# Patient Record
Sex: Male | Born: 1950 | Race: Black or African American | Hispanic: No | Marital: Married | Smoking: Current every day smoker
Health system: Southern US, Community
[De-identification: ages and names within clinical notes are randomized; demographics above are authoritative.]

## PROBLEM LIST (undated history)

## (undated) DIAGNOSIS — K219 Gastro-esophageal reflux disease without esophagitis: Secondary | ICD-10-CM

## (undated) DIAGNOSIS — R569 Unspecified convulsions: Secondary | ICD-10-CM

## (undated) DIAGNOSIS — E119 Type 2 diabetes mellitus without complications: Secondary | ICD-10-CM

## (undated) DIAGNOSIS — F32A Depression, unspecified: Secondary | ICD-10-CM

## (undated) DIAGNOSIS — F329 Major depressive disorder, single episode, unspecified: Secondary | ICD-10-CM

## (undated) DIAGNOSIS — G44009 Cluster headache syndrome, unspecified, not intractable: Secondary | ICD-10-CM

## (undated) DIAGNOSIS — D351 Benign neoplasm of parathyroid gland: Secondary | ICD-10-CM

## (undated) HISTORY — PX: SPLENECTOMY, TOTAL: SHX788

## (undated) HISTORY — PX: NASAL SEPTUM SURGERY: SHX37

---

## 1972-05-28 HISTORY — PX: TRACHEOSTOMY: SUR1362

## 2004-03-04 ENCOUNTER — Emergency Department: Payer: Self-pay | Admitting: Emergency Medicine

## 2004-07-25 ENCOUNTER — Emergency Department (HOSPITAL_COMMUNITY): Admission: EM | Admit: 2004-07-25 | Discharge: 2004-07-25 | Payer: Self-pay | Admitting: Emergency Medicine

## 2006-06-10 ENCOUNTER — Emergency Department: Payer: Self-pay | Admitting: Emergency Medicine

## 2007-12-20 ENCOUNTER — Emergency Department: Payer: Self-pay | Admitting: Emergency Medicine

## 2008-02-27 ENCOUNTER — Emergency Department: Payer: Self-pay | Admitting: Emergency Medicine

## 2008-09-22 ENCOUNTER — Emergency Department: Payer: Self-pay | Admitting: Emergency Medicine

## 2010-03-16 ENCOUNTER — Ambulatory Visit: Payer: Self-pay | Admitting: Unknown Physician Specialty

## 2010-03-28 ENCOUNTER — Ambulatory Visit: Payer: Self-pay | Admitting: Unknown Physician Specialty

## 2010-04-27 ENCOUNTER — Ambulatory Visit: Payer: Self-pay | Admitting: Unknown Physician Specialty

## 2010-05-28 ENCOUNTER — Ambulatory Visit: Payer: Self-pay | Admitting: Unknown Physician Specialty

## 2010-06-28 ENCOUNTER — Ambulatory Visit: Payer: Self-pay | Admitting: Unknown Physician Specialty

## 2010-08-08 ENCOUNTER — Other Ambulatory Visit (HOSPITAL_COMMUNITY): Payer: Self-pay

## 2011-03-09 ENCOUNTER — Ambulatory Visit: Payer: Self-pay | Admitting: Unknown Physician Specialty

## 2011-03-29 ENCOUNTER — Ambulatory Visit: Payer: Self-pay | Admitting: Unknown Physician Specialty

## 2011-04-28 ENCOUNTER — Ambulatory Visit: Payer: Self-pay | Admitting: Unknown Physician Specialty

## 2011-05-29 ENCOUNTER — Ambulatory Visit: Payer: Self-pay | Admitting: Unknown Physician Specialty

## 2011-06-14 ENCOUNTER — Ambulatory Visit: Payer: Self-pay | Admitting: Unknown Physician Specialty

## 2011-06-29 ENCOUNTER — Ambulatory Visit: Payer: Self-pay | Admitting: Unknown Physician Specialty

## 2011-07-06 LAB — RAPID URINE DRUG SCREEN, HOSP PERFORMED
Amphetamines, Ur Screen: NEGATIVE (ref ?–1000)
Barbiturates, Ur Screen: NEGATIVE (ref ?–200)
Benzodiazepine, Ur Scrn: NEGATIVE (ref ?–200)
Opiate, Ur Screen: NEGATIVE (ref ?–300)

## 2013-08-08 ENCOUNTER — Emergency Department: Payer: Self-pay | Admitting: Emergency Medicine

## 2013-09-17 ENCOUNTER — Emergency Department: Payer: Self-pay | Admitting: Emergency Medicine

## 2013-09-17 LAB — URINALYSIS, COMPLETE
BACTERIA: NONE SEEN
BILIRUBIN, UR: NEGATIVE
BLOOD: NEGATIVE
Glucose,UR: NEGATIVE mg/dL (ref 0–75)
Hyaline Cast: 3
KETONE: NEGATIVE
Leukocyte Esterase: NEGATIVE
Nitrite: NEGATIVE
Ph: 5 (ref 4.5–8.0)
Protein: 30
RBC,UR: 1 /HPF (ref 0–5)
Specific Gravity: 1.028 (ref 1.003–1.030)
Squamous Epithelial: <1

## 2013-09-17 LAB — CBC WITH DIFFERENTIAL/PLATELET
BASOS ABS: 0.1 10*3/uL (ref 0.0–0.1)
Basophil %: 1 %
EOS PCT: 1.3 %
Eosinophil #: 0.1 10*3/uL (ref 0.0–0.7)
HCT: 45.7 % (ref 40.0–52.0)
HGB: 15.5 g/dL (ref 13.0–18.0)
Lymphocyte #: 2.2 10*3/uL (ref 1.0–3.6)
Lymphocyte %: 24.9 %
MCH: 32.2 pg (ref 26.0–34.0)
MCHC: 33.8 g/dL (ref 32.0–36.0)
MCV: 95 fL (ref 80–100)
MONO ABS: 0.7 x10 3/mm (ref 0.2–1.0)
Monocyte %: 7.7 %
NEUTROS PCT: 65.1 %
Neutrophil #: 5.9 10*3/uL (ref 1.4–6.5)
Platelet: 240 10*3/uL (ref 150–440)
RBC: 4.8 10*6/uL (ref 4.40–5.90)
RDW: 13.9 % (ref 11.5–14.5)
WBC: 9 10*3/uL (ref 3.8–10.6)

## 2013-09-17 LAB — BASIC METABOLIC PANEL
ANION GAP: 6 — AB (ref 7–16)
BUN: 15 mg/dL (ref 7–18)
CHLORIDE: 106 mmol/L (ref 98–107)
CO2: 27 mmol/L (ref 21–32)
CREATININE: 1.45 mg/dL — AB (ref 0.60–1.30)
Calcium, Total: 8.8 mg/dL (ref 8.5–10.1)
GFR CALC AF AMER: 60 — AB
GFR CALC NON AF AMER: 52 — AB
GLUCOSE: 135 mg/dL — AB (ref 65–99)
OSMOLALITY: 280 (ref 275–301)
POTASSIUM: 4 mmol/L (ref 3.5–5.1)
Sodium: 139 mmol/L (ref 136–145)

## 2013-09-17 LAB — CK: CK, Total: 502 U/L — ABNORMAL HIGH

## 2013-09-18 LAB — URINE CULTURE

## 2013-09-22 LAB — CULTURE, BLOOD (SINGLE)

## 2014-03-29 ENCOUNTER — Encounter (HOSPITAL_COMMUNITY): Payer: Self-pay | Admitting: *Deleted

## 2014-03-29 ENCOUNTER — Emergency Department (HOSPITAL_COMMUNITY)
Admission: EM | Admit: 2014-03-29 | Discharge: 2014-03-29 | Disposition: A | Discharge status: Home / Self Care | Payer: BC Managed Care – PPO | Attending: Emergency Medicine | Admitting: Emergency Medicine

## 2014-03-29 ENCOUNTER — Emergency Department (HOSPITAL_COMMUNITY): Payer: BC Managed Care – PPO

## 2014-03-29 DIAGNOSIS — Y990 Civilian activity done for income or pay: Secondary | ICD-10-CM | POA: Diagnosis not present

## 2014-03-29 DIAGNOSIS — Z79899 Other long term (current) drug therapy: Secondary | ICD-10-CM | POA: Diagnosis not present

## 2014-03-29 DIAGNOSIS — Y9289 Other specified places as the place of occurrence of the external cause: Secondary | ICD-10-CM | POA: Insufficient documentation

## 2014-03-29 DIAGNOSIS — B351 Tinea unguium: Secondary | ICD-10-CM | POA: Insufficient documentation

## 2014-03-29 DIAGNOSIS — Z720 Tobacco use: Secondary | ICD-10-CM | POA: Insufficient documentation

## 2014-03-29 DIAGNOSIS — W1830XA Fall on same level, unspecified, initial encounter: Secondary | ICD-10-CM | POA: Insufficient documentation

## 2014-03-29 DIAGNOSIS — K219 Gastro-esophageal reflux disease without esophagitis: Secondary | ICD-10-CM | POA: Diagnosis not present

## 2014-03-29 DIAGNOSIS — M6281 Muscle weakness (generalized): Secondary | ICD-10-CM | POA: Insufficient documentation

## 2014-03-29 DIAGNOSIS — S8991XA Unspecified injury of right lower leg, initial encounter: Secondary | ICD-10-CM | POA: Insufficient documentation

## 2014-03-29 DIAGNOSIS — S8992XA Unspecified injury of left lower leg, initial encounter: Secondary | ICD-10-CM | POA: Diagnosis not present

## 2014-03-29 DIAGNOSIS — W1839XA Other fall on same level, initial encounter: Secondary | ICD-10-CM | POA: Diagnosis not present

## 2014-03-29 DIAGNOSIS — R29898 Other symptoms and signs involving the musculoskeletal system: Secondary | ICD-10-CM

## 2014-03-29 HISTORY — DX: Gastro-esophageal reflux disease without esophagitis: K21.9

## 2014-03-29 LAB — CBC WITH DIFFERENTIAL/PLATELET
BASOS ABS: 0 10*3/uL (ref 0.0–0.1)
BASOS PCT: 0 % (ref 0–1)
EOS ABS: 0.1 10*3/uL (ref 0.0–0.7)
Eosinophils Relative: 1 % (ref 0–5)
HCT: 41.2 % (ref 39.0–52.0)
Hemoglobin: 13.9 g/dL (ref 13.0–17.0)
Lymphocytes Relative: 13 % (ref 12–46)
Lymphs Abs: 1.9 10*3/uL (ref 0.7–4.0)
MCH: 31.7 pg (ref 26.0–34.0)
MCHC: 33.7 g/dL (ref 30.0–36.0)
MCV: 94.1 fL (ref 78.0–100.0)
MONOS PCT: 4 % (ref 3–12)
Monocytes Absolute: 0.6 10*3/uL (ref 0.1–1.0)
NEUTROS PCT: 82 % — AB (ref 43–77)
Neutro Abs: 12.5 10*3/uL — ABNORMAL HIGH (ref 1.7–7.7)
PLATELETS: 229 10*3/uL (ref 150–400)
RBC: 4.38 MIL/uL (ref 4.22–5.81)
RDW: 13.2 % (ref 11.5–15.5)
WBC: 15.2 10*3/uL — ABNORMAL HIGH (ref 4.0–10.5)

## 2014-03-29 LAB — COMPREHENSIVE METABOLIC PANEL
ALBUMIN: 3.9 g/dL (ref 3.5–5.2)
ALK PHOS: 60 U/L (ref 39–117)
ALT: 13 U/L (ref 0–53)
ANION GAP: 14 (ref 5–15)
AST: 20 U/L (ref 0–37)
BUN: 14 mg/dL (ref 6–23)
CO2: 22 mEq/L (ref 19–32)
Calcium: 9.5 mg/dL (ref 8.4–10.5)
Chloride: 103 mEq/L (ref 96–112)
Creatinine, Ser: 1.09 mg/dL (ref 0.50–1.35)
GFR calc Af Amer: 83 mL/min — ABNORMAL LOW (ref >90)
GFR calc non Af Amer: 71 mL/min — ABNORMAL LOW (ref >90)
Glucose, Bld: 96 mg/dL (ref 70–99)
POTASSIUM: 3.9 meq/L (ref 3.7–5.3)
SODIUM: 139 meq/L (ref 137–147)
TOTAL PROTEIN: 7.6 g/dL (ref 6.0–8.3)
Total Bilirubin: 0.6 mg/dL (ref 0.3–1.2)

## 2014-03-29 LAB — URINALYSIS, ROUTINE W REFLEX MICROSCOPIC
BILIRUBIN URINE: NEGATIVE
Glucose, UA: NEGATIVE mg/dL
Hgb urine dipstick: NEGATIVE
Ketones, ur: NEGATIVE mg/dL
LEUKOCYTES UA: NEGATIVE
NITRITE: NEGATIVE
PH: 7 (ref 5.0–8.0)
Protein, ur: NEGATIVE mg/dL
SPECIFIC GRAVITY, URINE: 1.016 (ref 1.005–1.030)
Urobilinogen, UA: 1 mg/dL (ref 0.0–1.0)

## 2014-03-29 NOTE — Discharge Instructions (Signed)
As discussed, it is important that you follow up as soon as possible with your physician for continued management of your condition.  Your episode of weakness, and ongoing pain is likely due to inflammation in the lower back.  Recall that you have one outstanding lab results, and he will be made aware of abnormal findings.  Please also recall that there is evidence for abnormalities about the first lumbar vertebrae.  This should be discussed with your primary care team.  If you develop any new, or concerning changes in your condition, please return to the emergency department immediately.

## 2014-03-29 NOTE — ED Notes (Signed)
Will get urine sample once patient returns from CT

## 2014-03-29 NOTE — ED Notes (Signed)
Bed: [id number] Expected date: 03/29/14 Expected time: 12:42 PM Means of arrival:  Comments: Ems

## 2014-03-29 NOTE — ED Provider Notes (Signed)
CSN: [id number]     Arrival date & time 03/29/14  1245 History   First MD Initiated Contact with Patient 03/29/14 1302     No chief complaint on file.     HPI  Patient presents with concern of ongoing leg pain.  Today the patient had an acute exacerbation, felt particularly weak, fell to the ground. Patient has [facility], and was recently prescribed gabapentin.  First dose was last night. Today, while at work, patient felt both of his legs with burning sensation posteriorly, felt weak, and fell to the ground. Patient denies loss of consciousness. Patient denies chest pain either before or after, or confusion before or after. Currently the patient has minimal complaints. He denies weakness in either extremity currently. He also denies abdominal pain or incontinence. Patient has bilateral onychomycosis in his feet, and is using only topical medication.  Past Medical History  Diagnosis Date  . GERD (gastroesophageal reflux disease)    Past Surgical History  Procedure Laterality Date  . Splenectomy, total     History reviewed. No pertinent family history. History  Substance Use Topics  . Smoking status: Current Every Day Smoker -- 1.00 packs/day    Types: Cigarettes  . Smokeless tobacco: Not on file  . Alcohol Use: No    Review of Systems  Constitutional:       Per HPI, otherwise negative  HENT:       Per HPI, otherwise negative  Respiratory:       Per HPI, otherwise negative  Cardiovascular:       Per HPI, otherwise negative  Gastrointestinal: Negative for vomiting.  Endocrine:       Negative aside from HPI  Genitourinary:       Neg aside from HPI   Musculoskeletal:       Per HPI, otherwise negative  Skin: Negative.   Neurological: Positive for weakness. Negative for syncope and headaches.      Allergies  Review of patient's allergies indicates no known allergies.  Home Medications   Prior to Admission  medications   Medication Sig Start Date End Date Taking? Authorizing Provider  gabapentin (NEURONTIN) 300 MG capsule Take 300 mg by mouth at bedtime.   Yes Historical Provider, MD  Ranitidine HCl (RANITIDINE 75 PO) Take 1 tablet by mouth 2 (two) times daily.   Yes Historical Provider, MD  tamsulosin (FLOMAX) 0.4 MG CAPS capsule Take 0.4 mg by mouth daily.   Yes Historical Provider, MD   BP 104/67 mmHg  Pulse 66  Temp(Src) 98.5 F (36.9 C)  Resp 17  SpO2 100% Physical Exam  Constitutional: He is oriented to person, place, and time. He appears well-developed. No distress.  HENT:  Head: Normocephalic and atraumatic.  Eyes: Conjunctivae and EOM are normal.  Cardiovascular: Normal rate and regular rhythm.   Pulmonary/Chest: Effort normal. No stridor. No respiratory distress.  Abdominal: He exhibits no distension.  Musculoskeletal: He exhibits no edema.  Neurological: He is alert and oriented to person, place, and time.  Bilateral lower extremity symmetric strength 5/5 in hips, knees, ankles. Face is symmetric, speech is clear, reflexes are appropriate in the distal lower extremity  Skin: Skin is warm and dry.  Bilateral diffuse fungal infections of all toenails  Psychiatric: He has a normal mood and affect.  Nursing note and vitals reviewed.   ED Course  Procedures (including critical care time) Labs Review Labs Reviewed  CBC WITH DIFFERENTIAL - Abnormal;  Notable for the following:    WBC 15.2 (*)    Neutrophils Relative % 82 (*)    Neutro Abs 12.5 (*)    All other components within normal limits  COMPREHENSIVE METABOLIC PANEL - Abnormal; Notable for the following:    GFR calc non Af Amer 71 (*)    GFR calc Af Amer 83 (*)    All other components within normal limits  URINALYSIS, ROUTINE W REFLEX MICROSCOPIC - Abnormal; Notable for the following:    APPearance CLOUDY (*)    All other components within normal limits  URINE CULTURE    Imaging Review Dg Lumbar Spine  Complete  03/29/2014   CLINICAL DATA:  Lower extremity weakness.  History of splenectomy.  EXAM: LUMBAR SPINE - COMPLETE 4+ VIEW  COMPARISON:  None.  FINDINGS: Minimal wedging of L1 of indeterminate age. Anatomic alignment. No worrisome osseous lesions. Vascular calcification. Intervertebral disc spaces are preserved. Lower lumbar facet arthropathy. Previous laparotomy with wire sutures. No pars defects. Lateral osseous spurring at multiple levels. Surgical clips in the LEFT upper quadrant correlate with the history of previous splenectomy.  IMPRESSION: Minimal wedging of L1 of indeterminate age. If further investigation is desired, consider MRI if no contraindications.   Electronically Signed   By: Davonna BellingJohn  Curnes M.D.   On: 03/29/2014 14:32   3:26 PM Patient in no distress, no current complaints.  I had a lengthy conversation with him and family members about all findings, including leukocytosis, which may be secondary to his acute pain episode.  No evidence for infection.  Patient will have urine culture sent. He will f/u w PMD w regards to LE pain, consideration of MRI.  MDM   Final diagnoses:  Lower extremity weakness    Patient presents after an episode of severe bilateral lower extremity posterior pain, sufficient to cause weakness and have the patient fall. Patient has no chest pain, headache, neurologic deficiencies, incontinence or abdominal pain suggesting neurologic compromise. X-ray does not minister lytic lesions. Patient is aware of all findings, including possible lumbar lesion, abnormal urinalysis. Patient will follow up with primary care for further evaluation and management.    Gerhard Munchobert Mamye Bolds, MD 03/29/14 [phone]

## 2014-03-29 NOTE — ED Notes (Signed)
Pt reports intermittent leg pain "for a while", sts was told by PCP he is borderline diabetic was started on neurontin for neuropathy and feet burning and took first dose last night. Pt reports having episodes of leg weakness in past but nothing like this.

## 2014-03-29 NOTE — ED Notes (Signed)
Per EMS.Pt at work and walking outside and reports his legs started to "feel heavy" with "burning going down my legs" and he was unable to take another step. He was able to guide himself to the ground by using a wall. Took first does of Nerotin for Neuropathy last pm. Pt in NAD.

## 2014-03-30 LAB — URINE CULTURE
COLONY COUNT: NO GROWTH
CULTURE: NO GROWTH
Special Requests: NORMAL

## 2017-02-11 ENCOUNTER — Encounter (HOSPITAL_COMMUNITY): Payer: Self-pay | Admitting: *Deleted

## 2017-02-11 ENCOUNTER — Emergency Department (HOSPITAL_COMMUNITY): Payer: BC Managed Care – PPO

## 2017-02-11 ENCOUNTER — Emergency Department (HOSPITAL_COMMUNITY)
Admission: EM | Admit: 2017-02-11 | Discharge: 2017-02-11 | Disposition: A | Discharge status: Home / Self Care | Payer: BC Managed Care – PPO | Attending: Emergency Medicine | Admitting: Emergency Medicine

## 2017-02-11 DIAGNOSIS — R531 Weakness: Secondary | ICD-10-CM | POA: Diagnosis not present

## 2017-02-11 DIAGNOSIS — F17210 Nicotine dependence, cigarettes, uncomplicated: Secondary | ICD-10-CM | POA: Diagnosis not present

## 2017-02-11 DIAGNOSIS — Z7982 Long term (current) use of aspirin: Secondary | ICD-10-CM | POA: Diagnosis not present

## 2017-02-11 DIAGNOSIS — Z79899 Other long term (current) drug therapy: Secondary | ICD-10-CM | POA: Diagnosis not present

## 2017-02-11 LAB — BASIC METABOLIC PANEL
ANION GAP: 8 (ref 5–15)
BUN: 18 mg/dL (ref 6–20)
CALCIUM: 9.6 mg/dL (ref 8.9–10.3)
CO2: 20 mmol/L — AB (ref 22–32)
Chloride: 107 mmol/L (ref 101–111)
Creatinine, Ser: 1.15 mg/dL (ref 0.61–1.24)
GFR calc Af Amer: >60 mL/min (ref >60)
GFR calc non Af Amer: >60 mL/min (ref >60)
GLUCOSE: 128 mg/dL — AB (ref 65–99)
Potassium: 4.3 mmol/L (ref 3.5–5.1)
Sodium: 135 mmol/L (ref 135–145)

## 2017-02-11 LAB — I-STAT TROPONIN, ED
TROPONIN I, POC: 0 ng/mL (ref 0.00–0.08)
Troponin i, poc: 0 ng/mL (ref 0.00–0.08)

## 2017-02-11 LAB — CBC
HEMATOCRIT: 41.8 % (ref 39.0–52.0)
HEMOGLOBIN: 14.1 g/dL (ref 13.0–17.0)
MCH: 31.8 pg (ref 26.0–34.0)
MCHC: 33.7 g/dL (ref 30.0–36.0)
MCV: 94.4 fL (ref 78.0–100.0)
PLATELETS: 228 10*3/uL (ref 150–400)
RBC: 4.43 MIL/uL (ref 4.22–5.81)
RDW: 13.6 % (ref 11.5–15.5)
WBC: 11.2 10*3/uL — ABNORMAL HIGH (ref 4.0–10.5)

## 2017-02-11 NOTE — ED Provider Notes (Signed)
[facility]-EMERGENCY DEPT Provider Note   CSN: [id number] Arrival date & time: 02/11/17  0809     History   Chief Complaint Chief Complaint  Patient presents with  . Weakness  . Chest Pain    HPI Glenn Torres is a 66 y.o. male.  HPI PT was at home today when he felt his sugar dropping.  He ate some candy but he started to feel weak and heavy in his legs.  He had some chest discomfort as well.  It lasted for less than 15 minutes. He called ems and was brought in.  All his symptoms have resolved and he feels fine. Past Medical History:  Diagnosis Date  . GERD (gastroesophageal reflux disease)     There are no active problems to display for this patient.   Past Surgical History:  Procedure Laterality Date  . SPLENECTOMY, TOTAL         Home Medications    Prior to Admission medications   Medication Sig Start Date End Date Taking? Authorizing Provider  acetaminophen (TYLENOL) 500 MG tablet Take 1,000 mg by mouth daily as needed for mild pain or headache.    Yes [provider]  aspirin EC 325 MG tablet Take 325 mg by mouth once.   Yes [provider]  budesonide-formoterol (SYMBICORT) 80-4.5 MCG/ACT inhaler Inhale 2 puffs into the lungs 2 (two) times daily.   Yes [provider]  buPROPion (WELLBUTRIN SR) 150 MG 12 hr tablet Take 150 mg by mouth 2 (two) times daily. 07/30/16  Yes [provider]  ibuprofen (ADVIL,MOTRIN) 200 MG tablet Take 400 mg by mouth every 6 (six) hours as needed for headache or mild pain.   Yes [provider]  omeprazole (PRILOSEC) 20 MG capsule Take 20 mg by mouth daily.   Yes [provider]  Ranitidine HCl (RANITIDINE 75 PO) Take 1 tablet by mouth 2 (two) times daily.   Yes [provider]  SUMAtriptan (IMITREX) 100 MG tablet Take 100 mg by mouth every 2 (two) hours as needed for migraine. May repeat in 2 hours if headache persists or recurs.   Yes [provider]  tamsulosin  (FLOMAX) 0.4 MG CAPS capsule Take 0.4 mg by mouth daily.   Yes [provider]  albuterol (PROVENTIL HFA;VENTOLIN HFA) 108 (90 Base) MCG/ACT inhaler Inhale 1 puff into the lungs daily as needed for wheezing or shortness of breath.     [provider]  gabapentin (NEURONTIN) 300 MG capsule Take 300 mg by mouth at bedtime.    [provider]  SUMAtriptan (IMITREX) 5 MG/ACT nasal spray Place 1 spray into the nose every 2 (two) hours as needed for migraine.     [provider]  tolnaftate (TINACTIN) 1 % cream Apply 1 application topically daily as needed (for feet).     [provider]    Family History No family history on file.  Social History Social History  Substance Use Topics  . Smoking status: Current Every Day Smoker    Packs/day: 1.00    Types: Cigarettes  . Smokeless tobacco: Never Used  . Alcohol use No     Allergies   Patient has no known allergies.   Review of Systems Review of Systems  Constitutional: Negative for fever.  Respiratory: Negative for cough and shortness of breath.   Gastrointestinal: Negative for diarrhea and vomiting.  Genitourinary: Negative for dysuria.  Neurological: Positive for weakness. Negative for numbness and headaches.  All other systems reviewed  and are negative.    Physical Exam Updated Vital Signs BP 131/84 (BP Location: Right Arm)   Pulse 71   Temp 97.9 F (36.6 C) (Oral)   Ht 1.727 m ( )   Wt 82.6 kg (182 lb)   SpO2 98%   BMI 27.67 kg/m   Physical Exam  Constitutional: He is oriented to person, place, and time. He appears well-developed and well-nourished. No distress.  HENT:  Head: Normocephalic and atraumatic.  Right Ear: External ear normal.  Left Ear: External ear normal.  Mouth/Throat: Oropharynx is clear and moist.  Eyes: Conjunctivae are normal. Right eye exhibits no discharge. Left eye exhibits no discharge. No scleral icterus.  Neck: Neck supple. No tracheal  deviation present.  Cardiovascular: Normal rate, regular rhythm and intact distal pulses.   Pulmonary/Chest: Effort normal and breath sounds normal. No stridor. No respiratory distress. He has no wheezes. He has no rales.  Abdominal: Soft. Bowel sounds are normal. He exhibits no distension. There is no tenderness. There is no rebound and no guarding.  Musculoskeletal: He exhibits no edema or tenderness.  Neurological: He is alert and oriented to person, place, and time. He has normal strength. No cranial nerve deficit (No facial droop, extraocular movements intact, tongue midline ) or sensory deficit. He exhibits normal muscle tone. He displays no seizure activity. Coordination normal.  No pronator drift bilateral upper extrem, able to hold both legs off bed for 5 seconds, sensation intact in all extremities, no visual field cuts, no left or right sided neglect, normal finger-nose exam bilaterally, no nystagmus noted   Skin: Skin is warm and dry. No rash noted.  Psychiatric: He has a normal mood and affect.  Nursing note and vitals reviewed.    ED Treatments / Results  Labs (all labs ordered are listed, but only abnormal results are displayed) Labs Reviewed  BASIC METABOLIC PANEL - Abnormal; Notable for the following:       Result Value   CO2 20 (*)    Glucose, Bld 128 (*)    All other components within normal limits  CBC - Abnormal; Notable for the following:    WBC 11.2 (*)    All other components within normal limits  I-STAT TROPONIN, ED  I-STAT TROPONIN, ED    EKG  EKG Interpretation  Date/Time:  Monday February 11 2017 08:13:19 EDT Ventricular Rate:  70 PR Interval:  142 QRS Duration: 84 QT Interval:  382 QTC Calculation: 412 R Axis:   82 Text Interpretation:  Normal sinus rhythm No significant change since last tracing aside from likely lead placement changes Normal ECG Confirmed by Gerhard Munch [phone]) on 02/11/2017 8:21:54 AM       Radiology Dg Chest 2  View  Result Date: 02/11/2017 CLINICAL DATA:  66 year old male with chest pain and tightness this morning. EXAM: CHEST  2 VIEW COMPARISON:  09/17/2013 and earlier. FINDINGS: Stable lung volumes, at the upper limits of normal to hyperinflated. Mediastinal contours remain normal. Visualized tracheal air column is within normal limits. No pneumothorax, pulmonary edema, pleural effusion or confluent pulmonary opacity. Borderline to mild increased interstitial markings. No pneumoperitoneum and negative visible bowel gas pattern. Chronic postoperative changes to the left upper quadrant and ventral abdomen. No acute osseous abnormality identified. Mild dextroconvex thoracic scoliosis. IMPRESSION: No acute cardiopulmonary abnormality. Electronically Signed   By: Odessa Fleming M.D.   On: 02/11/2017 08:48    Procedures Procedures (including critical care time)  Medications Ordered in ED Medications - No data  to display   Initial Impression / Assessment and Plan / ED Course  I have reviewed the triage vital signs and the nursing notes.  Pertinent labs & imaging results that were available during my care of the patient were reviewed by me and considered in my medical decision making (see chart for details).   patient presented to the emergency room for evaluation of an episode of generalized weakness and mild chest discomfort.  The chest pain was not severe. It lasted for less than 15 minutes. EKG is reassuring.The symptoms are not suggestive of unstable angina. Low risk heart score.  Patient had 2 sets of cardiac enzymes that are normal. He continues to feel fine without any discomfort. At this time there does not appear to be any evidence of an acute emergency medical condition and the patient appears stable for discharge with appropriate outpatient follow up.   Final Clinical Impressions(s) / ED Diagnoses   Final diagnoses:  Weakness    New Prescriptions New Prescriptions   No medications on file      Linwood Dibbles, MD 02/11/17 1428

## 2017-02-11 NOTE — ED Notes (Signed)
Got patient undress on the monitor patient is resting with call bell in reach 

## 2017-02-11 NOTE — ED Triage Notes (Signed)
To ED via [facility] for eval of short episode of cp and left side 'soreness' but has resolved. Pt appears in nad. Complains of minimal sob.

## 2017-04-13 LAB — POCT LIPID PANEL
HDL: 41
LDL: 126
TC: 206
TRG: 200

## 2017-04-13 LAB — GLUCOSE, POCT (MANUAL RESULT ENTRY): POC GLUCOSE: 94 mg/dL (ref 70–99)

## 2018-02-03 ENCOUNTER — Other Ambulatory Visit: Payer: Self-pay

## 2018-02-03 ENCOUNTER — Emergency Department (HOSPITAL_COMMUNITY): Payer: BC Managed Care – PPO

## 2018-02-03 ENCOUNTER — Emergency Department (HOSPITAL_COMMUNITY)
Admission: EM | Admit: 2018-02-03 | Discharge: 2018-02-03 | Disposition: A | Discharge status: Home / Self Care | Payer: BC Managed Care – PPO | Attending: Emergency Medicine | Admitting: Emergency Medicine

## 2018-02-03 ENCOUNTER — Encounter (HOSPITAL_COMMUNITY): Payer: Self-pay | Admitting: Emergency Medicine

## 2018-02-03 DIAGNOSIS — F17210 Nicotine dependence, cigarettes, uncomplicated: Secondary | ICD-10-CM | POA: Insufficient documentation

## 2018-02-03 DIAGNOSIS — Z79899 Other long term (current) drug therapy: Secondary | ICD-10-CM | POA: Insufficient documentation

## 2018-02-03 DIAGNOSIS — R0789 Other chest pain: Secondary | ICD-10-CM | POA: Diagnosis not present

## 2018-02-03 DIAGNOSIS — Z7982 Long term (current) use of aspirin: Secondary | ICD-10-CM | POA: Diagnosis not present

## 2018-02-03 DIAGNOSIS — E119 Type 2 diabetes mellitus without complications: Secondary | ICD-10-CM | POA: Insufficient documentation

## 2018-02-03 DIAGNOSIS — R531 Weakness: Secondary | ICD-10-CM | POA: Diagnosis present

## 2018-02-03 DIAGNOSIS — F329 Major depressive disorder, single episode, unspecified: Secondary | ICD-10-CM | POA: Insufficient documentation

## 2018-02-03 DIAGNOSIS — R42 Dizziness and giddiness: Secondary | ICD-10-CM | POA: Insufficient documentation

## 2018-02-03 HISTORY — DX: Major depressive disorder, single episode, unspecified: F32.9

## 2018-02-03 HISTORY — DX: Depression, unspecified: F32.A

## 2018-02-03 HISTORY — DX: Type 2 diabetes mellitus without complications: E11.9

## 2018-02-03 LAB — BASIC METABOLIC PANEL
Anion gap: 10 (ref 5–15)
BUN: 12 mg/dL (ref 8–23)
CHLORIDE: 104 mmol/L (ref 98–111)
CO2: 22 mmol/L (ref 22–32)
CREATININE: 1.11 mg/dL (ref 0.61–1.24)
Calcium: 9.9 mg/dL (ref 8.9–10.3)
GFR calc non Af Amer: >60 mL/min (ref >60)
Glucose, Bld: 99 mg/dL (ref 70–99)
Potassium: 4 mmol/L (ref 3.5–5.1)
SODIUM: 136 mmol/L (ref 135–145)

## 2018-02-03 LAB — CBC WITH DIFFERENTIAL/PLATELET
Abs Immature Granulocytes: 0.1 10*3/uL (ref 0.0–0.1)
BASOS ABS: 0 10*3/uL (ref 0.0–0.1)
BASOS PCT: 0 %
EOS ABS: 0.2 10*3/uL (ref 0.0–0.7)
Eosinophils Relative: 2 %
HEMATOCRIT: 43 % (ref 39.0–52.0)
Hemoglobin: 14.3 g/dL (ref 13.0–17.0)
Immature Granulocytes: 1 %
LYMPHS ABS: 2.7 10*3/uL (ref 0.7–4.0)
Lymphocytes Relative: 26 %
MCH: 31.8 pg (ref 26.0–34.0)
MCHC: 33.3 g/dL (ref 30.0–36.0)
MCV: 95.6 fL (ref 78.0–100.0)
Monocytes Absolute: 0.5 10*3/uL (ref 0.1–1.0)
Monocytes Relative: 5 %
NEUTROS PCT: 66 %
Neutro Abs: 7 10*3/uL (ref 1.7–7.7)
PLATELETS: 234 10*3/uL (ref 150–400)
RBC: 4.5 MIL/uL (ref 4.22–5.81)
RDW: 13.4 % (ref 11.5–15.5)
WBC: 10.5 10*3/uL (ref 4.0–10.5)

## 2018-02-03 LAB — I-STAT TROPONIN, ED: TROPONIN I, POC: 0.02 ng/mL (ref 0.00–0.08)

## 2018-02-03 LAB — CBG MONITORING, ED: GLUCOSE-CAPILLARY: 96 mg/dL (ref 70–99)

## 2018-02-03 MED ORDER — DILTIAZEM HCL-DEXTROSE 100-5 MG/100ML-% IV SOLN (PREMIX): 5.0000 mg/h | INTRAVENOUS | Status: DC

## 2018-02-03 NOTE — ED Notes (Signed)
Patient transported to X-ray 

## 2018-02-03 NOTE — ED Triage Notes (Signed)
Patient presents to the Ed with complaints of Chest pain and Dizziness. Patient reports blurred vision and bilateral leg pain. Patient denied any nausea, vomiting, shortness of breath and chest pain at the time. Patient states when he arrived here he noticed some chest pain and still has the blurred vision. Patient alert and oriented on arrival. No neuro symptoms.

## 2018-02-03 NOTE — ED Provider Notes (Signed)
[facility] EMERGENCY DEPARTMENT Provider Note   CSN: [id number] Arrival date & time: 02/03/18  0847     History   Chief Complaint Chief Complaint  Patient presents with  . Chest Pain  . Dizziness    HPI Tyreak Reagle is a 67 y.o. male.  He arrives by EMS after complaints of acute onset of weakness in his legs and feeling like he was in a pass out.  It was associated with some blurry vision and some dizziness which she describes as room spinning.  On transport by EMS he did also state he was started experiencing some chest pain that is since resolved.  Currently he is complaining of some tremors which are spasmodic twitches in his body.  Currently has no pain.  He states he is actually had 10 or 15 episodes of this type of event happened over the last 10 years or so.  He says he usually follows in the [place] and they have done multiple tests including CAT scans and MRIs EEGs have not found a cause for this.  Since his last event was over a year ago.  No recent illness no recent medication changes.  The history is provided by the patient.  Chest Pain   This is a recurrent problem. The current episode started less than 1 hour ago. The problem occurs constantly. The problem has been gradually improving. The pain is associated with movement. The pain is present in the substernal region. The pain is mild. The quality of the pain is described as pressure-like. The pain does not radiate. Associated symptoms include diaphoresis, dizziness, near-syncope and weakness. Pertinent negatives include no abdominal pain, no back pain, no claudication, no cough, no fever, no lower extremity edema, no shortness of breath, no sputum production and no vomiting. He has tried nothing for the symptoms. The treatment provided mild relief.  His past medical history is significant for diabetes.  Dizziness  Associated symptoms: chest pain and weakness   Associated symptoms: no shortness of breath and no  vomiting     Past Medical History:  Diagnosis Date  . Depression   . Diabetes mellitus without complication (HCC)   . GERD (gastroesophageal reflux disease)     There are no active problems to display for this patient.   Past Surgical History:  Procedure Laterality Date  . SPLENECTOMY, TOTAL          Home Medications    Prior to Admission medications   Medication Sig Start Date End Date Taking? Authorizing Provider  acetaminophen (TYLENOL) 500 MG tablet Take 1,000 mg by mouth daily as needed for mild pain or headache.     [provider]  albuterol (PROVENTIL HFA;VENTOLIN HFA) 108 (90 Base) MCG/ACT inhaler Inhale 1 puff into the lungs daily as needed for wheezing or shortness of breath.     [provider]  aspirin EC 325 MG tablet Take 325 mg by mouth once.    [provider]  budesonide-formoterol (SYMBICORT) 80-4.5 MCG/ACT inhaler Inhale 2 puffs into the lungs 2 (two) times daily.    [provider]  buPROPion (WELLBUTRIN SR) 150 MG 12 hr tablet Take 150 mg by mouth 2 (two) times daily. 07/30/16   [provider]  gabapentin (NEURONTIN) 300 MG capsule Take 300 mg by mouth at bedtime.    [provider]  ibuprofen (ADVIL,MOTRIN) 200 MG tablet Take 400 mg by mouth every 6 (six) hours as needed for headache or mild pain.  [provider]  omeprazole (PRILOSEC) 20 MG capsule Take 20 mg by mouth daily.    [provider]  Ranitidine HCl (RANITIDINE 75 PO) Take 1 tablet by mouth 2 (two) times daily.    [provider]  SUMAtriptan (IMITREX) 100 MG tablet Take 100 mg by mouth every 2 (two) hours as needed for migraine. May repeat in 2 hours if headache persists or recurs.    [provider]  SUMAtriptan (IMITREX) 5 MG/ACT nasal spray Place 1 spray into the nose every 2 (two) hours as needed for migraine.     [provider]  tamsulosin (FLOMAX) 0.4 MG CAPS capsule Take 0.4 mg by mouth  daily.    [provider]  tolnaftate (TINACTIN) 1 % cream Apply 1 application topically daily as needed (for feet).     [provider]    Family History No family history on file.  Social History Social History   Tobacco Use  . Smoking status: Current Every Day Smoker    Packs/day: 1.00    Types: Cigarettes  . Smokeless tobacco: Never Used  Substance Use Topics  . Alcohol use: No  . Drug use: No     Allergies   Patient has no known allergies.   Review of Systems Review of Systems  Constitutional: Positive for diaphoresis. Negative for fever.  HENT: Negative for sore throat.   Eyes: Positive for visual disturbance.  Respiratory: Negative for cough, sputum production and shortness of breath.   Cardiovascular: Positive for chest pain and near-syncope. Negative for claudication and leg swelling.  Gastrointestinal: Negative for abdominal pain and vomiting.  Genitourinary: Negative for dysuria and hematuria.  Musculoskeletal: Negative for back pain and neck pain.  Skin: Negative for rash.  Neurological: Positive for dizziness, weakness and light-headedness.     Physical Exam Updated Vital Signs BP 137/72 (BP Location: Left Arm)   Pulse 62   Temp (!) 97.5 F (36.4 C) (Oral)   Resp 20   SpO2 100%   Physical Exam  Constitutional: He is oriented to person, place, and time. He appears well-developed and well-nourished.  HENT:  Head: Normocephalic and atraumatic.  Eyes: Conjunctivae are normal.  Neck: Neck supple.  Cardiovascular: Normal rate, regular rhythm and normal pulses.  No murmur heard. Pulmonary/Chest: Effort normal and breath sounds normal. No respiratory distress.  Abdominal: Soft. There is no tenderness.  Musculoskeletal: He exhibits no edema.       Right lower leg: He exhibits no tenderness and no edema.       Left lower leg: He exhibits no tenderness and no edema.  Neurological: He is alert and oriented to person, place, and time. He  has normal strength. No cranial nerve deficit or sensory deficit. GCS eye subscore is 4. GCS verbal subscore is 5. GCS motor subscore is 6.  Skin: Skin is warm and dry.  Psychiatric: He has a normal mood and affect.  Nursing note and vitals reviewed.    ED Treatments / Results  Labs (all labs ordered are listed, but only abnormal results are displayed) Labs Reviewed  BASIC METABOLIC PANEL  CBC WITH DIFFERENTIAL/PLATELET  I-STAT TROPONIN, ED  CBG MONITORING, ED    EKG EKG Interpretation  Date/Time:  Monday February 03 2018 08:57:04 EDT Ventricular Rate:  62 PR Interval:  150 QRS Duration: 88 QT Interval:  424 QTC Calculation: 430 R Axis:   83 Text Interpretation:  Normal sinus rhythm Normal ECG similar to prior 9/18 Confirmed by Meridee Score (  [id number]) on 02/03/2018 9:03:34 AM Also confirmed by Meridee Score [phone]), editor Elita Quick [phone])  on 02/03/2018 10:29:12 AM   Radiology Dg Chest 2 View  Result Date: 02/03/2018 CLINICAL DATA:  Near syncopal episode. EXAM: CHEST - 2 VIEW COMPARISON:  02/11/2017 FINDINGS: Cardiomediastinal silhouette is normal. Mediastinal contours appear intact. Tortuosity and calcific atherosclerotic disease at the aortic arch. There is no evidence of focal airspace consolidation, pleural effusion or pneumothorax. Osseous structures are without acute abnormality. Soft tissues are grossly normal. IMPRESSION: No active cardiopulmonary disease. Electronically Signed   By: Ted Mcalpine M.D.   On: 02/03/2018 11:02    Procedures Procedures (including critical care time)  Medications Ordered in ED Medications - No data to display   Initial Impression / Assessment and Plan / ED Course  I have reviewed the triage vital signs and the nursing notes.  Pertinent labs & imaging results that were available during my care of the patient were reviewed by me and considered in my medical decision making (see chart for details).  Clinical Course as  of Feb 04 1022  Mon Feb 03, 2018  169 67 year old male with episode of weakness in his legs with dizziness/vertigo and brief episode of chest pain.  All of his symptoms seem to be resolving here.  States he has had multiple work-ups for this in the past and they never found anything.  His EKG is unremarkable and he is getting blood work and chest x-ray.   [MB]  1333 Mr. Seward symptoms are all resolved and he is eaten here.  We will check some orthostatics and get him up and walk him.  If he is feeling back to baseline he likely can be discharged and he has a follow-up with his PCP tomorrow.   [MB]    Clinical Course User Index [MB] Terrilee Files, MD     Final Clinical Impressions(s) / ED Diagnoses   Final diagnoses:  Dizziness  Atypical chest pain    ED Discharge Orders    None       Terrilee Files, MD 02/04/18 1023

## 2018-02-03 NOTE — Discharge Instructions (Addendum)
You were seen in the emergency department for leg weakness chest pain and dizziness.  You had x-rays and EKG and blood work that did not show an obvious cause of your symptoms.  Your symptoms improved while you were here and you felt well enough for discharge.  Please follow-up with your primary care doctor as scheduled and return if any concerns.

## 2019-02-15 ENCOUNTER — Encounter (HOSPITAL_COMMUNITY): Payer: Self-pay | Admitting: Student

## 2019-02-15 ENCOUNTER — Emergency Department (HOSPITAL_COMMUNITY): Payer: BC Managed Care – PPO

## 2019-02-15 ENCOUNTER — Emergency Department (HOSPITAL_COMMUNITY)
Admission: EM | Admit: 2019-02-15 | Discharge: 2019-02-15 | Disposition: A | Discharge status: Home / Self Care | Payer: BC Managed Care – PPO | Attending: Emergency Medicine | Admitting: Emergency Medicine

## 2019-02-15 DIAGNOSIS — R51 Headache: Secondary | ICD-10-CM | POA: Insufficient documentation

## 2019-02-15 DIAGNOSIS — E119 Type 2 diabetes mellitus without complications: Secondary | ICD-10-CM | POA: Diagnosis not present

## 2019-02-15 DIAGNOSIS — Z79899 Other long term (current) drug therapy: Secondary | ICD-10-CM | POA: Insufficient documentation

## 2019-02-15 DIAGNOSIS — R079 Chest pain, unspecified: Secondary | ICD-10-CM | POA: Diagnosis not present

## 2019-02-15 DIAGNOSIS — R519 Headache, unspecified: Secondary | ICD-10-CM

## 2019-02-15 DIAGNOSIS — F17210 Nicotine dependence, cigarettes, uncomplicated: Secondary | ICD-10-CM | POA: Diagnosis not present

## 2019-02-15 DIAGNOSIS — Z7982 Long term (current) use of aspirin: Secondary | ICD-10-CM | POA: Insufficient documentation

## 2019-02-15 LAB — CBG MONITORING, ED: Glucose-Capillary: 97 mg/dL (ref 70–99)

## 2019-02-15 LAB — URINALYSIS, ROUTINE W REFLEX MICROSCOPIC
Bilirubin Urine: NEGATIVE
Glucose, UA: NEGATIVE mg/dL
Hgb urine dipstick: NEGATIVE
Ketones, ur: NEGATIVE mg/dL
Leukocytes,Ua: NEGATIVE
Nitrite: NEGATIVE
Protein, ur: NEGATIVE mg/dL
Specific Gravity, Urine: 1.024 (ref 1.005–1.030)
pH: 7 (ref 5.0–8.0)

## 2019-02-15 LAB — COMPREHENSIVE METABOLIC PANEL
ALT: 22 U/L (ref 0–44)
AST: 28 U/L (ref 15–41)
Albumin: 4.1 g/dL (ref 3.5–5.0)
Alkaline Phosphatase: 57 U/L (ref 38–126)
Anion gap: 12 (ref 5–15)
BUN: 14 mg/dL (ref 8–23)
CO2: 18 mmol/L — ABNORMAL LOW (ref 22–32)
Calcium: 10.6 mg/dL — ABNORMAL HIGH (ref 8.9–10.3)
Chloride: 106 mmol/L (ref 98–111)
Creatinine, Ser: 1.17 mg/dL (ref 0.61–1.24)
GFR calc Af Amer: >60 mL/min (ref >60)
GFR calc non Af Amer: >60 mL/min (ref >60)
Glucose, Bld: 108 mg/dL — ABNORMAL HIGH (ref 70–99)
Potassium: 4 mmol/L (ref 3.5–5.1)
Sodium: 136 mmol/L (ref 135–145)
Total Bilirubin: 1 mg/dL (ref 0.3–1.2)
Total Protein: 7.6 g/dL (ref 6.5–8.1)

## 2019-02-15 LAB — DIFFERENTIAL
Abs Immature Granulocytes: 0.03 10*3/uL (ref 0.00–0.07)
Basophils Absolute: 0.1 10*3/uL (ref 0.0–0.1)
Basophils Relative: 1 %
Eosinophils Absolute: 0.2 10*3/uL (ref 0.0–0.5)
Eosinophils Relative: 2 %
Immature Granulocytes: 0 %
Lymphocytes Relative: 34 %
Lymphs Abs: 3.5 10*3/uL (ref 0.7–4.0)
Monocytes Absolute: 0.8 10*3/uL (ref 0.1–1.0)
Monocytes Relative: 8 %
Neutro Abs: 5.6 10*3/uL (ref 1.7–7.7)
Neutrophils Relative %: 55 %

## 2019-02-15 LAB — CBC
HCT: 44.8 % (ref 39.0–52.0)
Hemoglobin: 15.7 g/dL (ref 13.0–17.0)
MCH: 33.1 pg (ref 26.0–34.0)
MCHC: 35 g/dL (ref 30.0–36.0)
MCV: 94.5 fL (ref 80.0–100.0)
Platelets: 198 10*3/uL (ref 150–400)
RBC: 4.74 MIL/uL (ref 4.22–5.81)
RDW: 13.2 % (ref 11.5–15.5)
WBC: 10.1 10*3/uL (ref 4.0–10.5)
nRBC: 0 % (ref 0.0–0.2)

## 2019-02-15 LAB — I-STAT CHEM 8, ED
BUN: 16 mg/dL (ref 8–23)
Calcium, Ion: 1.19 mmol/L (ref 1.15–1.40)
Chloride: 107 mmol/L (ref 98–111)
Creatinine, Ser: 1.1 mg/dL (ref 0.61–1.24)
Glucose, Bld: 102 mg/dL — ABNORMAL HIGH (ref 70–99)
HCT: 47 % (ref 39.0–52.0)
Hemoglobin: 16 g/dL (ref 13.0–17.0)
Potassium: 4.1 mmol/L (ref 3.5–5.1)
Sodium: 138 mmol/L (ref 135–145)
TCO2: 21 mmol/L — ABNORMAL LOW (ref 22–32)

## 2019-02-15 LAB — PROTIME-INR
INR: 1 (ref 0.8–1.2)
Prothrombin Time: 13.1 seconds (ref 11.4–15.2)

## 2019-02-15 LAB — RAPID URINE DRUG SCREEN, HOSP PERFORMED
Amphetamines: NOT DETECTED
Barbiturates: NOT DETECTED
Benzodiazepines: NOT DETECTED
Cocaine: NOT DETECTED
Opiates: NOT DETECTED
Tetrahydrocannabinol: NOT DETECTED

## 2019-02-15 LAB — TROPONIN I (HIGH SENSITIVITY): Troponin I (High Sensitivity): 5 ng/L (ref <18)

## 2019-02-15 LAB — APTT: aPTT: 38 seconds — ABNORMAL HIGH (ref 24–36)

## 2019-02-15 MED ORDER — DIPHENHYDRAMINE HCL 50 MG/ML IJ SOLN
12.5000 mg | Freq: Once | INTRAMUSCULAR | Status: AC
  Administered 2019-02-15: 12.5 mg via INTRAVENOUS
  Filled 2019-02-15: qty 1

## 2019-02-15 MED ORDER — PROCHLORPERAZINE EDISYLATE 10 MG/2ML IJ SOLN
10.0000 mg | Freq: Once | INTRAMUSCULAR | Status: AC
  Administered 2019-02-15: 12:00:00 10 mg via INTRAVENOUS
  Filled 2019-02-15: qty 2

## 2019-02-15 MED ORDER — IOHEXOL 350 MG/ML SOLN
100.0000 mL | Freq: Once | INTRAVENOUS | Status: AC | PRN
  Administered 2019-02-15: 100 mL via INTRAVENOUS

## 2019-02-15 MED ORDER — SODIUM CHLORIDE 0.9 % IV BOLUS: 500.0000 mL | Freq: Once | INTRAVENOUS | Status: DC

## 2019-02-15 MED ORDER — SODIUM CHLORIDE 0.9% FLUSH
3.0000 mL | Freq: Once | INTRAVENOUS | Status: AC
  Administered 2019-02-15: 3 mL via INTRAVENOUS

## 2019-02-15 MED ORDER — SODIUM CHLORIDE 0.9 % IV BOLUS
500.0000 mL | Freq: Once | INTRAVENOUS | Status: AC
  Administered 2019-02-15: 12:00:00 500 mL via INTRAVENOUS

## 2019-02-15 NOTE — Consult Note (Signed)
Requesting Physician: Dr. Sedonia Small     Chief Complaint: Severe headache, stuttering speech : difficulty getting words out"   History obtained from: Patient and Chart    HPI:                                                                                                                                       Glenn Torres is a 68 y.o. male with past medical history significant for depression, diabetes mellitus, PTSD, migraines presents to the ED with sudden sudden onset severe headache at 10 AM this morning.  Patient states that today around 10 AM he developed a sudden severe headache at the back of his head on 10 AM.  This lasted for several minutes and slightly improved gradually and then would worsen again.  He noticed that he had difficulty getting words out and felt weak all over during this episode as well as dizzy.  He normally has migraine headaches but they are usually in the front of his head, never in the back and never started with such severity.  EMS was called and when they arrived they felt that he may have had a left facial droop.  He also had reduced grip strength in the left hand but no drift in the left upper extremity.  He was stuttering his words.  His headache will get better and then get worse again. His blood pressure was 299 systolic during the episode.  On arrival the patient stated his headache was 7 out of 10.  Apart from slurring of his words patient had no neurological deficits and NIH stroke scale was 0.  A stat CT head was unremarkable for any hemorrhage including subarachnoid blood.  CTA was negative for LVO/aneurysm.  History of seizure like activity on naproxen.  Not on any aspirin, blood thinners   Date last known well:  Time last known well:  tPA Given:  NIHSS:  Baseline MRS     Past Medical History:  Diagnosis Date  . Depression   . Diabetes mellitus without complication ([place])   . GERD (gastroesophageal reflux disease)     Past Surgical History:   Procedure Laterality Date  . SPLENECTOMY, TOTAL      History reviewed. No pertinent family history. Social History:  reports that he has been smoking cigarettes. He has been smoking about 1.00 pack per day. He has never used smokeless tobacco. He reports that he does not drink alcohol or use drugs.  Allergies:  Allergies  Allergen Reactions  . Gabapentin Other (See Comments)    "made pt feel like he was having a seizure"    Medications:  I reviewed home medications   ROS:                                                                                                                                     14 systems reviewed and negative except above   Examination:                                                                                                      General: Appears well-developed   Psych: Affect appropriate to situation Eyes: No scleral injection HENT: No OP obstrucion Head: Normocephalic.  Cardiovascular: Normal rate and regular rhythm. Respiratory: Effort normal and breath sounds normal to anterior ascultation GI: Soft.  No distension. There is no tenderness.  Skin: WDI    Neurological Examination Mental Status: Alert, oriented, thought content appropriate.  Speech fluent with mild stuttering at times without evidence of aphasia. Able to follow 3 step commands without difficulty. Cranial Nerves: II: Visual fields grossly normal,  III,IV, VI: ptosis not present, extra-ocular motions intact bilaterally, pupils equal, round, reactive to light and accommodation V,VII: smile symmetric, facial light touch sensation normal bilaterally VIII: hearing normal bilaterally IX,X: uvula rises symmetrically XI: bilateral shoulder shrug XII: midline tongue extension Motor: Right : Upper extremity   5/5    Left:     Upper extremity   5/5  Lower extremity    5/5     Lower extremity   5/5 Tone and bulk:normal tone throughout; no atrophy noted Sensory: Pinprick and light touch intact throughout, bilaterally Deep Tendon Reflexes: 2+ and symmetric throughout Plantars: Right: downgoing   Left: downgoing Cerebellar: normal finger-to-nose, normal rapid alternating movements and normal heel-to-shin test Gait: normal gait and station     Lab Results: Basic Metabolic Panel: Recent Labs  Lab 02/15/19 1106 02/15/19 1114  NA 136 138  K 4.0 4.1  CL 106 107  CO2 18*  --   GLUCOSE 108* 102*  BUN 14 16  CREATININE 1.17 1.10  CALCIUM 10.6*  --     CBC: Recent Labs  Lab 02/15/19 1106 02/15/19 1114  WBC 10.1  --   NEUTROABS 5.6  --   HGB 15.7 16.0  HCT 44.8 47.0  MCV 94.5  --   PLT 198  --     Coagulation Studies: Recent Labs    02/15/19 1106  LABPROT 13.1  INR 1.0    Imaging: Ct Angio Head W Or Wo Contrast  Result Date: 02/15/2019 CLINICAL DATA:  Aphasia.  Weakness.  Headache. EXAM: CT ANGIOGRAPHY HEAD AND NECK TECHNIQUE: Multidetector CT imaging of the head and neck was performed using the standard protocol during bolus administration of intravenous contrast. Multiplanar CT image reconstructions and MIPs were obtained to evaluate the vascular anatomy. Carotid stenosis measurements (when applicable) are obtained utilizing NASCET criteria, using the distal internal carotid diameter as the denominator. CONTRAST:  OMNIPAQUE IOHEXOL 350 MG/ML SOLN COMPARISON:  None. FINDINGS: CTA NECK FINDINGS Aortic arch: Standard 3 vessel aortic arch with widely patent arch vessel origins. Right carotid system: Patent without evidence of stenosis, dissection, or significant atherosclerosis. Left carotid system: Patent without evidence of stenosis, dissection, or significant atherosclerosis. Vertebral arteries: Patent without evidence of stenosis, dissection, or significant atherosclerosis. Mildly dominant right vertebral artery. Skeleton: Mild lower  cervical disc degeneration. Severe left facet arthrosis at C3-4 greater than C2-3 with moderate left neural foraminal stenosis at the former. Other neck: No evidence of cervical lymphadenopathy or mass. Upper chest: No apical lung consolidation or mass. Mild centrilobular emphysema. Review of the MIP images confirms the above findings CTA HEAD FINDINGS Anterior circulation: The internal carotid arteries are widely patent from skull base to carotid termini. ACAs and MCAs are patent without evidence of proximal branch occlusion or significant proximal stenosis. No aneurysm is identified. Posterior circulation: The intracranial vertebral arteries are widely patent to the basilar. Patent PICAs and SCAs are seen bilaterally. The basilar artery is widely patent. Posterior communicating arteries are diminutive or absent. PCAs are patent without evidence of significant proximal stenosis. No aneurysm is identified. Venous sinuses: As permitted by contrast timing, patent. Anatomic variants: None. Review of the MIP images confirms the above findings IMPRESSION: 1. No large vessel occlusion, stenosis, or significant atherosclerosis in the head or neck. 2.  Emphysema (ICD10-J43.9). These results were communicated to Dr. Laurence Slate at 11:27 a.m. on 02/15/2019 by text page via the [facility] messaging system. Electronically Signed   By: Sebastian Ache M.D.   On: 02/15/2019 12:00   Ct Angio Neck W Or Wo Contrast  Result Date: 02/15/2019 CLINICAL DATA:  Aphasia.  Weakness.  Headache. EXAM: CT ANGIOGRAPHY HEAD AND NECK TECHNIQUE: Multidetector CT imaging of the head and neck was performed using the standard protocol during bolus administration of intravenous contrast. Multiplanar CT image reconstructions and MIPs were obtained to evaluate the vascular anatomy. Carotid stenosis measurements (when applicable) are obtained utilizing NASCET criteria, using the distal internal carotid diameter as the denominator. CONTRAST:  OMNIPAQUE IOHEXOL  350 MG/ML SOLN COMPARISON:  None. FINDINGS: CTA NECK FINDINGS Aortic arch: Standard 3 vessel aortic arch with widely patent arch vessel origins. Right carotid system: Patent without evidence of stenosis, dissection, or significant atherosclerosis. Left carotid system: Patent without evidence of stenosis, dissection, or significant atherosclerosis. Vertebral arteries: Patent without evidence of stenosis, dissection, or significant atherosclerosis. Mildly dominant right vertebral artery. Skeleton: Mild lower cervical disc degeneration. Severe left facet arthrosis at C3-4 greater than C2-3 with moderate left neural foraminal stenosis at the former. Other neck: No evidence of cervical lymphadenopathy or mass. Upper chest: No apical lung consolidation or mass. Mild centrilobular emphysema. Review of the MIP images confirms the above findings CTA HEAD FINDINGS Anterior circulation: The internal carotid arteries are widely patent from skull base to carotid termini. ACAs and MCAs are patent without evidence of proximal branch occlusion or significant proximal stenosis. No aneurysm is identified. Posterior circulation: The intracranial vertebral arteries are widely patent to the basilar. Patent PICAs and SCAs are seen bilaterally. The basilar artery is widely  patent. Posterior communicating arteries are diminutive or absent. PCAs are patent without evidence of significant proximal stenosis. No aneurysm is identified. Venous sinuses: As permitted by contrast timing, patent. Anatomic variants: None. Review of the MIP images confirms the above findings IMPRESSION: 1. No large vessel occlusion, stenosis, or significant atherosclerosis in the head or neck. 2.  Emphysema (ICD10-J43.9). These results were communicated to Dr. Laurence SlateAroor at 11:27 a.m. on 02/15/2019 by text page via the [facility] system. Electronically Signed   By: Sebastian AcheAllen  Grady M.D.   On: 02/15/2019 12:00   Dg Chest Portable 1 View  Result Date:  02/15/2019 CLINICAL DATA:  Chest pain EXAM: PORTABLE CHEST 1 VIEW COMPARISON:  02/13/2018 FINDINGS: The heart size and mediastinal contours are within normal limits. Both lungs are clear. The visualized skeletal structures are unremarkable. IMPRESSION: No acute abnormality of the lungs in AP portable projection. Electronically Signed   By: Lauralyn PrimesAlex  Bibbey M.D.   On: 02/15/2019 12:16   Ct Head Code Stroke Wo Contrast  Result Date: 02/15/2019 CLINICAL DATA:  Code stroke.  Aphasia.  Weakness. EXAM: CT HEAD WITHOUT CONTRAST TECHNIQUE: Contiguous axial images were obtained from the base of the skull through the vertex without intravenous contrast. COMPARISON:  08/08/2013 FINDINGS: Brain: There is no evidence of acute infarct, intracranial hemorrhage, mass, midline shift, or extra-axial fluid collection. The ventricles and sulci are within normal limits for age. Vascular: Mildly asymmetrically increased density of the distal left ICA and left MCA relative to the right though with similar density to the basilar artery arguing against acute embolus and with patency demonstrated on subsequent CTA. Skull: No fracture or focal osseous lesion. Sinuses/Orbits: Extensive left frontal, left greater than right ethmoid, and left maxillary sinus opacification. Clear mastoid air cells. Unremarkable orbits. Other: None. ASPECTS [facility]([place] Stroke Program Early CT Score) - Ganglionic level infarction (caudate, lentiform nuclei, internal capsule, insula, M1-M3 cortex): 7 - Supraganglionic infarction (M4-M6 cortex): 3 Total score (0-10 with 10 being normal): 10 IMPRESSION: 1. Unremarkable CT appearance of the brain. 2. ASPECTS is 10. These results were communicated to Dr. Laurence SlateAroor at 11:27 amon 02/15/2019 by text page via the [facility] messaging system. Electronically Signed   By: Sebastian AcheAllen  Grady M.D.   On: 02/15/2019 11:28     ASSESSMENT AND PLAN  [age] year old male with past medical history of migraines, PTSD, depression, diabetes mellitus  presents to the ED with severe sudden onset headache.  Headache was waxing and waning.  No neck rigidity.  Patient had some stuttering of speech but no other neurological deficits on arrival.   CT head performed mainly to rule out subarachnoid blood for thunderclap headache, negative.  CT head negative done in the for 6 hours with no blood to help rule out sentinel bleed.  CTA negative for any aneurysm.  Patient also does not have any neck rigidity, significant photophobia.  Other consideration should be RCVS (reversible cerebrovascular vasoconstriction syndrome), however unlikely given negative CTA changes. Based on his history, unlikely that this is a TIA.  Seizure consideration however no seizure-like activity noted and patient did not lose any consciousness.  Impression Sudden onset headache  Complicated migraine vs RCVS  Recommendations -Migraine cocktail - MRI brain and if negative patient can be discharged    Darenda Fike [facility] Pager Number [phone]

## 2019-02-15 NOTE — ED Notes (Signed)
Patient transported to MRI 

## 2019-02-15 NOTE — ED Provider Notes (Addendum)
[facility] EMERGENCY DEPARTMENT Provider Note   CSN: [id number] Arrival date & time: 02/15/19  1100     History   Chief Complaint Chief Complaint  Patient presents with   Headache    HPI Glenn Torres is a 68 y.o. male with a history of diabetes, depression, GERD, and tobacco abuse who presents to the emergency department via EMS as a code stroke with complaints of a headache that began around 9:30-10 AM.  Patient states that he was shampooing the carpet when he got a fairly sudden onset headache to the frontal and occiput region, described as sharp, has been intermittent since onset.  Upon ER arrival and assessment of the bridge headache was improving, however once returned from CT imaging states it is coming back.  No specific alleviating or aggravating factors.  He reports associated blurry vision to the bilateral eyes, dizziness like the room spinning, as well as some nausea.  He thinks that the left arm/leg are somewhat weaker/heavier than the contralateral side.  He states that at one point the head pain became very bad in his chest started to feel tight, this was brief in duration and has not recurred.  He denies head injury, numbness, facial droop, speech difficulty, emesis, syncope, shortness of breath, fever, chills, or URI symptoms.  He has a history of headaches but has never had 1 to the back of his head like this before.     HPI  Past Medical History:  Diagnosis Date   Depression    Diabetes mellitus without complication (HCC)    GERD (gastroesophageal reflux disease)     There are no active problems to display for this patient.   Past Surgical History:  Procedure Laterality Date   SPLENECTOMY, TOTAL          Home Medications    Prior to Admission medications   Medication Sig Start Date End Date Taking? Authorizing Provider  acetaminophen (TYLENOL) 500 MG tablet Take 1,000 mg by mouth daily as needed for mild pain or headache.      [provider]  albuterol (PROVENTIL HFA;VENTOLIN HFA) 108 (90 Base) MCG/ACT inhaler Inhale 1 puff into the lungs daily as needed for wheezing or shortness of breath.     [provider]  aspirin EC 325 MG tablet Take 325 mg by mouth as needed for mild pain.     [provider]  budesonide-formoterol (SYMBICORT) 80-4.5 MCG/ACT inhaler Inhale 2 puffs into the lungs 2 (two) times daily.    [provider]  buPROPion (WELLBUTRIN SR) 150 MG 12 hr tablet Take 150 mg by mouth 2 (two) times daily. 07/30/16   [provider]  ibuprofen (ADVIL,MOTRIN) 200 MG tablet Take 400 mg by mouth every 6 (six) hours as needed for headache or mild pain.    [provider]  omeprazole (PRILOSEC) 20 MG capsule Take 20 mg by mouth daily.    [provider]  Ranitidine HCl (RANITIDINE 75 PO) Take 75 mg by mouth 2 (two) times daily.     [provider]  SUMAtriptan (IMITREX) 100 MG tablet Take 100 mg by mouth every 2 (two) hours as needed for migraine. May repeat in 2 hours if headache persists or recurs.    [provider]  SUMAtriptan (IMITREX) 5 MG/ACT nasal spray Place 1 spray into the nose every 2 (two) hours as needed for migraine.     [provider]  tamsulosin (FLOMAX) 0.4 MG CAPS capsule Take 0.4 mg  by mouth daily.    [provider]  tolnaftate (TINACTIN) 1 % cream Apply 1 application topically daily as needed (for feet).     [provider]  TRAZODONE HCL PO Take 1 tablet by mouth at bedtime.    [provider]    Family History No family history on file.  Social History Social History   Tobacco Use   Smoking status: Current Every Day Smoker    Packs/day: 1.00    Types: Cigarettes   Smokeless tobacco: Never Used  Substance Use Topics   Alcohol use: No   Drug use: No     Allergies   Gabapentin   Review of Systems Review of Systems  Constitutional: Negative for chills and  fever.  HENT: Negative for congestion, ear pain and sore throat.   Eyes: Positive for visual disturbance.  Respiratory: Negative for shortness of breath.   Cardiovascular: Positive for chest pain (resolved @ present).  Gastrointestinal: Positive for nausea. Negative for abdominal pain, blood in stool, constipation, diarrhea and vomiting.  Neurological: Positive for dizziness, weakness and headaches. Negative for seizures, syncope, facial asymmetry, speech difficulty and numbness.  All other systems reviewed and are negative.    Physical Exam Updated Vital Signs There were no vitals taken for this visit.  Physical Exam Vitals signs and nursing note reviewed.  Constitutional:      General: He is not in acute distress.    Appearance: He is well-developed. He is not toxic-appearing.  HENT:     Head: Normocephalic and atraumatic.     Mouth/Throat:     Comments: Uvula midline. Eyes:     General:        Right eye: No discharge.        Left eye: No discharge.     Extraocular Movements: Extraocular movements intact.     Conjunctiva/sclera: Conjunctivae normal.     Pupils: Pupils are equal, round, and reactive to light.     Comments: No proptosis.  Neck:     Musculoskeletal: Normal range of motion and neck supple. No neck rigidity.  Cardiovascular:     Rate and Rhythm: Normal rate and regular rhythm.  Pulmonary:     Effort: Pulmonary effort is normal. No respiratory distress.     Breath sounds: Normal breath sounds. No wheezing, rhonchi or rales.  Abdominal:     General: There is no distension.     Palpations: Abdomen is soft.     Tenderness: There is no abdominal tenderness.  Skin:    General: Skin is warm and dry.     Capillary Refill: Capillary refill takes less than 2 seconds.     Findings: No rash.  Neurological:     Mental Status: He is alert.     Comments: Alert. Clear speech. No facial droop. CNIII-XII grossly intact. Bilateral upper and lower extremities' sensation  grossly intact. 5/5 symmetric strength with grip strength and with plantar and dorsi flexion bilaterally. Normal finger to nose bilaterally. Negative pronator drift.   Psychiatric:        Behavior: Behavior normal.      ED Treatments / Results  Labs (all labs ordered are listed, but only abnormal results are displayed) Labs Reviewed  APTT - Abnormal; Notable for the following components:      Result Value   aPTT 38 (*)    All other components within normal limits  I-STAT CHEM 8, ED - Abnormal; Notable for the following components:   Glucose, Bld 102 (*)  TCO2 21 (*)    All other components within normal limits  PROTIME-INR  CBC  DIFFERENTIAL  COMPREHENSIVE METABOLIC PANEL  CBG MONITORING, ED  TROPONIN I (HIGH SENSITIVITY)    EKG EKG Interpretation  Date/Time:  Sunday February 15 2019 11:33:42 EDT Ventricular Rate:  64 PR Interval:    QRS Duration: 94 QT Interval:  422 QTC Calculation: 436 R Axis:   81 Text Interpretation:  Sinus rhythm Borderline right axis deviation Confirmed by Kennis Carina [id number]) on 02/15/2019 11:38:08 AM   Radiology Ct Angio Head W Or Wo Contrast  Result Date: 02/15/2019 CLINICAL DATA:  Aphasia.  Weakness.  Headache. EXAM: CT ANGIOGRAPHY HEAD AND NECK TECHNIQUE: Multidetector CT imaging of the head and neck was performed using the standard protocol during bolus administration of intravenous contrast. Multiplanar CT image reconstructions and MIPs were obtained to evaluate the vascular anatomy. Carotid stenosis measurements (when applicable) are obtained utilizing NASCET criteria, using the distal internal carotid diameter as the denominator. CONTRAST:  OMNIPAQUE IOHEXOL 350 MG/ML SOLN COMPARISON:  None. FINDINGS: CTA NECK FINDINGS Aortic arch: Standard 3 vessel aortic arch with widely patent arch vessel origins. Right carotid system: Patent without evidence of stenosis, dissection, or significant atherosclerosis. Left carotid system: Patent  without evidence of stenosis, dissection, or significant atherosclerosis. Vertebral arteries: Patent without evidence of stenosis, dissection, or significant atherosclerosis. Mildly dominant right vertebral artery. Skeleton: Mild lower cervical disc degeneration. Severe left facet arthrosis at C3-4 greater than C2-3 with moderate left neural foraminal stenosis at the former. Other neck: No evidence of cervical lymphadenopathy or mass. Upper chest: No apical lung consolidation or mass. Mild centrilobular emphysema. Review of the MIP images confirms the above findings CTA HEAD FINDINGS Anterior circulation: The internal carotid arteries are widely patent from skull base to carotid termini. ACAs and MCAs are patent without evidence of proximal branch occlusion or significant proximal stenosis. No aneurysm is identified. Posterior circulation: The intracranial vertebral arteries are widely patent to the basilar. Patent PICAs and SCAs are seen bilaterally. The basilar artery is widely patent. Posterior communicating arteries are diminutive or absent. PCAs are patent without evidence of significant proximal stenosis. No aneurysm is identified. Venous sinuses: As permitted by contrast timing, patent. Anatomic variants: None. Review of the MIP images confirms the above findings IMPRESSION: 1. No large vessel occlusion, stenosis, or significant atherosclerosis in the head or neck. 2.  Emphysema (ICD10-J43.9). These results were communicated to Dr. Laurence Slate at 11:27 a.m. on 02/15/2019 by text page via the [facility] messaging system. Electronically Signed   By: Sebastian Ache M.D.   On: 02/15/2019 12:00   Ct Angio Neck W Or Wo Contrast  Result Date: 02/15/2019 CLINICAL DATA:  Aphasia.  Weakness.  Headache. EXAM: CT ANGIOGRAPHY HEAD AND NECK TECHNIQUE: Multidetector CT imaging of the head and neck was performed using the standard protocol during bolus administration of intravenous contrast. Multiplanar CT image reconstructions and  MIPs were obtained to evaluate the vascular anatomy. Carotid stenosis measurements (when applicable) are obtained utilizing NASCET criteria, using the distal internal carotid diameter as the denominator. CONTRAST:  OMNIPAQUE IOHEXOL 350 MG/ML SOLN COMPARISON:  None. FINDINGS: CTA NECK FINDINGS Aortic arch: Standard 3 vessel aortic arch with widely patent arch vessel origins. Right carotid system: Patent without evidence of stenosis, dissection, or significant atherosclerosis. Left carotid system: Patent without evidence of stenosis, dissection, or significant atherosclerosis. Vertebral arteries: Patent without evidence of stenosis, dissection, or significant atherosclerosis. Mildly dominant right vertebral artery. Skeleton: Mild lower cervical disc  degeneration. Severe left facet arthrosis at C3-4 greater than C2-3 with moderate left neural foraminal stenosis at the former. Other neck: No evidence of cervical lymphadenopathy or mass. Upper chest: No apical lung consolidation or mass. Mild centrilobular emphysema. Review of the MIP images confirms the above findings CTA HEAD FINDINGS Anterior circulation: The internal carotid arteries are widely patent from skull base to carotid termini. ACAs and MCAs are patent without evidence of proximal branch occlusion or significant proximal stenosis. No aneurysm is identified. Posterior circulation: The intracranial vertebral arteries are widely patent to the basilar. Patent PICAs and SCAs are seen bilaterally. The basilar artery is widely patent. Posterior communicating arteries are diminutive or absent. PCAs are patent without evidence of significant proximal stenosis. No aneurysm is identified. Venous sinuses: As permitted by contrast timing, patent. Anatomic variants: None. Review of the MIP images confirms the above findings IMPRESSION: 1. No large vessel occlusion, stenosis, or significant atherosclerosis in the head or neck. 2.  Emphysema (ICD10-J43.9). These  results were communicated to Dr. Laurence SlateAroor at 11:27 a.m. on 02/15/2019 by text page via the [facility] messaging system. Electronically Signed   By: Sebastian AcheAllen  Grady M.D.   On: 02/15/2019 12:00   Mr Brain Wo Contrast  Result Date: 02/15/2019 CLINICAL DATA:  Acute headache with associated speech disturbance, weakness, and dizziness. EXAM: MRI HEAD WITHOUT CONTRAST TECHNIQUE: Multiplanar, multiecho pulse sequences of the brain and surrounding structures were obtained without intravenous contrast. COMPARISON:  Head CT and CTA 02/15/2019 FINDINGS: Brain: There is no evidence of acute infarct, intracranial hemorrhage, mass, midline shift, or extra-axial fluid collection. The ventricles and sulci are normal. No significant cerebral white matter disease is seen. There is a subcentimeter chronic left cerebellar infarct. Vascular: Major intracranial vascular flow voids are preserved. Skull and upper cervical spine: Unremarkable bone marrow signal. Sinuses/Orbits: Complete left frontal, left ethmoid, and left maxillary sinus opacification with with an underlying left maxillary sinus mucocele not excluded. Moderate right ethmoid sinus opacification. Clear mastoid air cells. Unremarkable orbits. Other: None. IMPRESSION: 1. No acute intracranial abnormality. 2. Small chronic left cerebellar infarct. 3. Extensive, predominantly left-sided chronic sinus disease as above. Electronically Signed   By: Sebastian AcheAllen  Grady M.D.   On: 02/15/2019 14:52   Dg Chest Portable 1 View  Result Date: 02/15/2019 CLINICAL DATA:  Chest pain EXAM: PORTABLE CHEST 1 VIEW COMPARISON:  02/13/2018 FINDINGS: The heart size and mediastinal contours are within normal limits. Both lungs are clear. The visualized skeletal structures are unremarkable. IMPRESSION: No acute abnormality of the lungs in AP portable projection. Electronically Signed   By: Lauralyn PrimesAlex  Bibbey M.D.   On: 02/15/2019 12:16   Ct Head Code Stroke Wo Contrast  Result Date: 02/15/2019 CLINICAL DATA:  Code  stroke.  Aphasia.  Weakness. EXAM: CT HEAD WITHOUT CONTRAST TECHNIQUE: Contiguous axial images were obtained from the base of the skull through the vertex without intravenous contrast. COMPARISON:  08/08/2013 FINDINGS: Brain: There is no evidence of acute infarct, intracranial hemorrhage, mass, midline shift, or extra-axial fluid collection. The ventricles and sulci are within normal limits for age. Vascular: Mildly asymmetrically increased density of the distal left ICA and left MCA relative to the right though with similar density to the basilar artery arguing against acute embolus and with patency demonstrated on subsequent CTA. Skull: No fracture or focal osseous lesion. Sinuses/Orbits: Extensive left frontal, left greater than right ethmoid, and left maxillary sinus opacification. Clear mastoid air cells. Unremarkable orbits. Other: None. ASPECTS [facility]([place] Stroke Program Early CT Score) - Ganglionic  level infarction (caudate, lentiform nuclei, internal capsule, insula, M1-M3 cortex): 7 - Supraganglionic infarction (M4-M6 cortex): 3 Total score (0-10 with 10 being normal): 10 IMPRESSION: 1. Unremarkable CT appearance of the brain. 2. ASPECTS is 10. These results were communicated to Dr. Laurence Slate at 11:27 amon 02/15/2019 by text page via the [facility] messaging system. Electronically Signed   By: Sebastian Ache M.D.   On: 02/15/2019 11:28    Procedures Procedures (including critical care time)  Medications Ordered in ED Medications  sodium chloride flush (NS) 0.9 % injection 3 mL (has no administration in time range)  prochlorperazine (COMPAZINE) injection 10 mg (has no administration in time range)  diphenhydrAMINE (BENADRYL) injection 12.5 mg (has no administration in time range)  sodium chloride 0.9 % bolus 500 mL (has no administration in time range)  iohexol (OMNIPAQUE) 350 MG/ML injection 100 mL (100 mLs Intravenous Contrast Given 02/15/19 1123)    Initial Impression / Assessment and Plan / ED Course  I  have reviewed the triage vital signs and the nursing notes.  Pertinent labs & imaging results that were available during my care of the patient were reviewed by me and considered in my medical decision making (see chart for details).   Patient presents to the ED via EMS as a code stroke with complaints of headache with associated symptoms, also mentions brief episode of chest tightness which is resolved at present.  Patient nontoxic-appearing, resting fairly comfortably, vitals without significant abnormality.  He has a fairly benign physical exam.  No focal neurologic deficits.  He was evaluated at the bridge and taken for CT head without contrast, no acute abnormalities noted, also had CTAs performed No large vessel occlusion, stenosis, or significant atherosclerosis in the head or neck.  Will trial migraine cocktail with Compazine/Benadryl and 500 cc of fluid.  Will add on EKG and troponin given his brief episode of chest discomfort.  Supervising physician Dr. Pilar Plate in agreement.  CT/CTA of the head/neck: No significant acute abnormalities CBC: No anemia, thrombocytopenia, leukopenia, or leukocytosis.  CMP: Mild hypercalcemia- receiving fluids. Bicarb mildly low. Renal function preserved. Anion gap WNL.  APTT/PT/INR: no significant abnormalities.  UA: No UTI UDS: Negative.  EKG: No STEMI, sinus, borderline R axis Initial troponin: 5  14:00: RE-EVAL: Patient feeling much better, resting comfortably, states his headache is about a 2 out of 10 in severity currently, he is requesting food as he thinks that the residual discomfort may be secondary to hunger.  He passes stroke swallow screen, will allow patient to eat in the emergency department.  Patient was seen and evaluated by neurologist Dr. Narda Amber for migraine cocktail which has been given after patient to have MRI of the brain, if negative can be discharged from a neurologic standpoint. MRI w/o acute findings- small chronic left  cerebellar infarct and sinus disease. On re-assessment headache resolved. He has a neurologist he feels comfortable follow up with.  In regards to his brief chest pain - EKG W/o STEMI or significantly concerning ischemic changes, initial troponin 5- his chest pain was very brief, has not re-occurred ~ 3 hours from onset (borderline) discussed w/ Dr. Pilar Plate- in agreement with no repeat troponin at this time, no other concerning features to the brief chest pain- his reported LUE/LLE weakness did not occur at same time of chest discomfort and was not present on exam, no widened mediastinum on CXR, symmetric pulses- doubt dissection. Low risk wells doubt PE. CXR/labs w/o concerning features regarding chest discomfort. Patient feels ready to  go home. Will discharge with close follow up. I discussed results, treatment plan, need for follow-up, and return precautions with the patient. Provided opportunity for questions, patient confirmed understanding and is in agreement with plan.   Findings and plan of care discussed with supervising physician Dr. Pilar Plate who has evaluated the patient & is in agreement.   Final Clinical Impressions(s) / ED Diagnoses   Final diagnoses:  Bad headache    ED Discharge Orders    None       Cherly Anderson, PA-C 02/15/19 [address], [place] R, PA-C 02/15/19 1527    Cherly Anderson, PA-C 02/15/19 1556    Sabas Sous, MD 02/20/19 1539

## 2019-02-15 NOTE — ED Notes (Signed)
Glenn Torres (daughter) [phone].

## 2019-02-15 NOTE — ED Notes (Signed)
Pt at MRI, blood draw upon return

## 2019-02-15 NOTE — ED Triage Notes (Signed)
Patient arrives as Code Stroke, LKW 1000 today - was at home with wife when he had sudden onset severe headache, states "the worst headache of my life." He reports headaches were intermittent and accompanied by dizziness, hot flashes, and difficulty getting his words out. Per EMS, he also had a minor L facial droop that resolved prior to arrival to ED. Patient exhibiting no facial droop, extremity weakness, speech changes, or vision changes on arrival to ED. A&O x 4.

## 2019-02-15 NOTE — Discharge Instructions (Addendum)
You are seen in the emergency department today for a headache.  Your work-up in the ER was overall reassuring.  Your labs did not show substantial abnormalities, your calcium was a bit elevated have this rechecked by primary care.  Your CT imaging of the head did not show any significant findings.  Your MRI did not show anything new, however did show sinus disease as well as an old stroke in the left cerebellar region.  You were given a migraine cocktail in the ER with improvement.  We would like you to follow-up closely with your neurologist within 3 days.  Return to the ER for new or worsening symptoms including but not limited to worsening headache, change in quality of headache, inability to keep fluids down, numbness, weakness, dizziness, fever, or any other concerns.

## 2019-12-30 ENCOUNTER — Emergency Department: Payer: BC Managed Care – PPO

## 2019-12-30 ENCOUNTER — Other Ambulatory Visit: Payer: Self-pay

## 2019-12-30 ENCOUNTER — Emergency Department
Admission: EM | Admit: 2019-12-30 | Discharge: 2019-12-30 | Disposition: A | Discharge status: Home / Self Care | Payer: BC Managed Care – PPO | Attending: Emergency Medicine | Admitting: Emergency Medicine

## 2019-12-30 DIAGNOSIS — Z5321 Procedure and treatment not carried out due to patient leaving prior to being seen by health care provider: Secondary | ICD-10-CM | POA: Diagnosis not present

## 2019-12-30 DIAGNOSIS — M542 Cervicalgia: Secondary | ICD-10-CM | POA: Insufficient documentation

## 2019-12-30 DIAGNOSIS — R079 Chest pain, unspecified: Secondary | ICD-10-CM | POA: Diagnosis not present

## 2019-12-30 LAB — CBC WITH DIFFERENTIAL/PLATELET
Abs Immature Granulocytes: 0.03 10*3/uL (ref 0.00–0.07)
Basophils Absolute: 0 10*3/uL (ref 0.0–0.1)
Basophils Relative: 0 %
Eosinophils Absolute: 0.2 10*3/uL (ref 0.0–0.5)
Eosinophils Relative: 2 %
HCT: 42.2 % (ref 39.0–52.0)
Hemoglobin: 14.2 g/dL (ref 13.0–17.0)
Immature Granulocytes: 0 %
Lymphocytes Relative: 24 %
Lymphs Abs: 2.5 10*3/uL (ref 0.7–4.0)
MCH: 32 pg (ref 26.0–34.0)
MCHC: 33.6 g/dL (ref 30.0–36.0)
MCV: 95 fL (ref 80.0–100.0)
Monocytes Absolute: 0.7 10*3/uL (ref 0.1–1.0)
Monocytes Relative: 6 %
Neutro Abs: 7.2 10*3/uL (ref 1.7–7.7)
Neutrophils Relative %: 68 %
Platelets: 197 10*3/uL (ref 150–400)
RBC: 4.44 MIL/uL (ref 4.22–5.81)
RDW: 13.1 % (ref 11.5–15.5)
WBC: 10.7 10*3/uL — ABNORMAL HIGH (ref 4.0–10.5)
nRBC: 0 % (ref 0.0–0.2)

## 2019-12-30 LAB — COMPREHENSIVE METABOLIC PANEL
ALT: 19 U/L (ref 0–44)
AST: 28 U/L (ref 15–41)
Albumin: 4.4 g/dL (ref 3.5–5.0)
Alkaline Phosphatase: 54 U/L (ref 38–126)
Anion gap: 9 (ref 5–15)
BUN: 13 mg/dL (ref 8–23)
CO2: 23 mmol/L (ref 22–32)
Calcium: 10.5 mg/dL — ABNORMAL HIGH (ref 8.9–10.3)
Chloride: 104 mmol/L (ref 98–111)
Creatinine, Ser: 1.16 mg/dL (ref 0.61–1.24)
GFR calc Af Amer: >60 mL/min (ref >60)
GFR calc non Af Amer: >60 mL/min (ref >60)
Glucose, Bld: 83 mg/dL (ref 70–99)
Potassium: 3.9 mmol/L (ref 3.5–5.1)
Sodium: 136 mmol/L (ref 135–145)
Total Bilirubin: 1 mg/dL (ref 0.3–1.2)
Total Protein: 7.6 g/dL (ref 6.5–8.1)

## 2019-12-30 MED ORDER — IOHEXOL 350 MG/ML SOLN
75.0000 mL | Freq: Once | INTRAVENOUS | Status: AC | PRN
  Administered 2019-12-30: 75 mL via INTRAVENOUS

## 2019-12-30 NOTE — ED Triage Notes (Signed)
Pt comes POV with sudden, acute, bilateral neck pain starting 3 hours ago. Hx of strokes. Pt also reports chest pain. Verbal orders received from Dr. Katrinka Blazing.

## 2020-01-04 ENCOUNTER — Telehealth: Payer: Self-pay | Admitting: Emergency Medicine

## 2020-01-04 NOTE — Telephone Encounter (Signed)
Called patient due to lwot to inquire about condition and follow up plans. Left message.   

## 2020-04-14 ENCOUNTER — Emergency Department: Payer: No Typology Code available for payment source

## 2020-04-14 ENCOUNTER — Other Ambulatory Visit: Payer: Self-pay

## 2020-04-14 DIAGNOSIS — R202 Paresthesia of skin: Secondary | ICD-10-CM | POA: Diagnosis not present

## 2020-04-14 DIAGNOSIS — Z7982 Long term (current) use of aspirin: Secondary | ICD-10-CM | POA: Insufficient documentation

## 2020-04-14 DIAGNOSIS — F17210 Nicotine dependence, cigarettes, uncomplicated: Secondary | ICD-10-CM | POA: Diagnosis not present

## 2020-04-14 DIAGNOSIS — E119 Type 2 diabetes mellitus without complications: Secondary | ICD-10-CM | POA: Diagnosis not present

## 2020-04-14 DIAGNOSIS — R531 Weakness: Secondary | ICD-10-CM | POA: Diagnosis present

## 2020-04-14 LAB — COMPREHENSIVE METABOLIC PANEL
ALT: 19 U/L (ref 0–44)
AST: 23 U/L (ref 15–41)
Albumin: 3.9 g/dL (ref 3.5–5.0)
Alkaline Phosphatase: 51 U/L (ref 38–126)
Anion gap: 7 (ref 5–15)
BUN: 18 mg/dL (ref 8–23)
CO2: 27 mmol/L (ref 22–32)
Calcium: 10.1 mg/dL (ref 8.9–10.3)
Chloride: 105 mmol/L (ref 98–111)
Creatinine, Ser: 1.25 mg/dL — ABNORMAL HIGH (ref 0.61–1.24)
GFR, Estimated: >60 mL/min (ref >60)
Glucose, Bld: 100 mg/dL — ABNORMAL HIGH (ref 70–99)
Potassium: 4.1 mmol/L (ref 3.5–5.1)
Sodium: 139 mmol/L (ref 135–145)
Total Bilirubin: 0.8 mg/dL (ref 0.3–1.2)
Total Protein: 7.4 g/dL (ref 6.5–8.1)

## 2020-04-14 LAB — DIFFERENTIAL
Abs Immature Granulocytes: 0.03 10*3/uL (ref 0.00–0.07)
Basophils Absolute: 0 10*3/uL (ref 0.0–0.1)
Basophils Relative: 0 %
Eosinophils Absolute: 0.3 10*3/uL (ref 0.0–0.5)
Eosinophils Relative: 2 %
Immature Granulocytes: 0 %
Lymphocytes Relative: 25 %
Lymphs Abs: 3 10*3/uL (ref 0.7–4.0)
Monocytes Absolute: 0.9 10*3/uL (ref 0.1–1.0)
Monocytes Relative: 8 %
Neutro Abs: 8 10*3/uL — ABNORMAL HIGH (ref 1.7–7.7)
Neutrophils Relative %: 65 %

## 2020-04-14 LAB — CBC
HCT: 41.2 % (ref 39.0–52.0)
Hemoglobin: 13.9 g/dL (ref 13.0–17.0)
MCH: 32.3 pg (ref 26.0–34.0)
MCHC: 33.7 g/dL (ref 30.0–36.0)
MCV: 95.8 fL (ref 80.0–100.0)
Platelets: 194 10*3/uL (ref 150–400)
RBC: 4.3 MIL/uL (ref 4.22–5.81)
RDW: 13.5 % (ref 11.5–15.5)
WBC: 12.3 10*3/uL — ABNORMAL HIGH (ref 4.0–10.5)
nRBC: 0 % (ref 0.0–0.2)

## 2020-04-14 LAB — PROTIME-INR
INR: 1 (ref 0.8–1.2)
Prothrombin Time: 12.3 seconds (ref 11.4–15.2)

## 2020-04-14 LAB — APTT: aPTT: 37 seconds — ABNORMAL HIGH (ref 24–36)

## 2020-04-14 NOTE — ED Triage Notes (Signed)
PT to ED via EMS from home. PT had episode of right arm tingling and numbness. PT  Tried to walk and legs gave out from under him. PT has had these episodes for about a year and has been seen by neuro and has not had a dx. Episodes usually last approx 10 min. PT states if he sits down, issue subsides but he has to be careful getting up. PT is alert and oriented, stroke screen negative and equal grips per ems

## 2020-04-14 NOTE — ED Triage Notes (Signed)
EMS brings pt in from home for c/o weakness & rt arm numbness; neurocheck WNL; pt reports symptoms intermittently x yr

## 2020-04-15 ENCOUNTER — Emergency Department: Payer: No Typology Code available for payment source

## 2020-04-15 ENCOUNTER — Emergency Department
Admission: EM | Admit: 2020-04-15 | Discharge: 2020-04-15 | Disposition: A | Discharge status: Home / Self Care | Payer: No Typology Code available for payment source | Attending: Emergency Medicine | Admitting: Emergency Medicine

## 2020-04-15 DIAGNOSIS — R202 Paresthesia of skin: Secondary | ICD-10-CM

## 2020-04-15 MED ORDER — CLOPIDOGREL BISULFATE 75 MG PO TABS
75.0000 mg | ORAL_TABLET | Freq: Once | ORAL | Status: AC
  Administered 2020-04-15: 75 mg via ORAL
  Filled 2020-04-15: qty 1

## 2020-04-15 MED ORDER — ASPIRIN EC 81 MG PO TBEC
81.0000 mg | DELAYED_RELEASE_TABLET | Freq: Once | ORAL | Status: AC
  Administered 2020-04-15: 81 mg via ORAL
  Filled 2020-04-15: qty 1

## 2020-04-15 MED ORDER — CLOPIDOGREL BISULFATE 75 MG PO TABS: 75.0000 mg | ORAL_TABLET | Freq: Every day | ORAL | 0 refills | Status: AC

## 2020-04-15 NOTE — ED Provider Notes (Signed)
[facility] Emergency Department Provider Note   ____________________________________________   First MD Initiated Contact with Patient 04/15/20 0032     (approximate)  I have reviewed the triage vital signs and the nursing notes.   HISTORY  Chief Complaint Weakness    HPI Glenn Torres is a 69 y.o. male here for evaluation of right sided weakness  Patient is a history of diabetes depression and previous distant stroke.  Patient ports he follows the [facility].  He is seen neurologist in the he has had several episodes over the last year well he will have about 15 minutes of numbness and weakness in his right arm and right leg that will seem to go away on its own.  He takes a baby aspirin daily.  Sees neurologist for same  Reports he was out on his porch tonight, around 6 or 7 PM reports he had sudden onset of numbness in the right arm and right leg and then weakness as of the 2 become paralyzed.  They called EMS on EMS arrival his symptoms were still present somewhat, he attempted to ambulate and fell onto his left knee.  The area is slightly tender.  Denies it feels broken or it is severely injured though.  All the symptoms resolved including the weakness in the right arm and right leg after about 15 minutes.  All symptoms are resolved.  No headache except yesterday had a slight headache over his left lower occipital region that lasted for about 2 days but went away  No chest pain or shortness of breath no fevers or chills.  No neck pain.  He did not have any trouble speaking and he did not have any drooping of his face during today's episode  Reports he seen his neurologist for the same symptoms and they have yet to find a cause, mention possibly seizures  Unfortunately [facility] are not available for request at this time 1am   Past Medical History:  Diagnosis Date  . Depression   . Diabetes mellitus without complication (HCC)   .  GERD (gastroesophageal reflux disease)     There are no problems to display for this patient.   Past Surgical History:  Procedure Laterality Date  . SPLENECTOMY, TOTAL    . TRACHEOSTOMY  1974    Prior to Admission medications   Medication Sig Start Date End Date Taking? Authorizing Provider  acetaminophen (TYLENOL) 500 MG tablet Take 1,000 mg by mouth daily as needed for mild pain or headache.     [provider]  albuterol (PROVENTIL HFA;VENTOLIN HFA) 108 (90 Base) MCG/ACT inhaler Inhale 1 puff into the lungs daily as needed for wheezing or shortness of breath.     [provider]  aspirin EC 81 MG tablet Take 81 mg by mouth daily.    [provider]  budesonide-formoterol (SYMBICORT) 80-4.5 MCG/ACT inhaler Inhale 2 puffs into the lungs 2 (two) times daily.    [provider]  buPROPion (WELLBUTRIN SR) 150 MG 12 hr tablet Take 150 mg by mouth 2 (two) times daily. 07/30/16   [provider]  clopidogrel (PLAVIX) 75 MG tablet Take 1 tablet (75 mg total) by mouth daily for 21 days. 04/15/20 05/06/20  Sharyn Creamer, MD  ibuprofen (ADVIL,MOTRIN) 200 MG tablet Take 400 mg by mouth every 6 (six) hours as needed for headache or mild pain.    [provider]  Multiple Vitamins-Minerals (MULTIVITAMIN WITH MINERALS) tablet Take 1 tablet by  mouth daily.    [provider]  sertraline (ZOLOFT) 100 MG tablet Take 100 mg by mouth daily. 01/24/19   [provider]  SUMAtriptan (IMITREX) 100 MG tablet Take 100 mg by mouth every 2 (two) hours as needed for migraine. May repeat in 2 hours if headache persists or recurs.    [provider]  SUMAtriptan (IMITREX) 5 MG/ACT nasal spray Place 1 spray into the nose every 2 (two) hours as needed for migraine.     [provider]  tamsulosin (FLOMAX) 0.4 MG CAPS capsule Take 0.4 mg by mouth daily.    [provider]  tolnaftate (TINACTIN) 1 % cream Apply 1 application  topically daily.     [provider]  traZODone (DESYREL) 100 MG tablet Take 100 mg by mouth at bedtime.    [provider]    Allergies Gabapentin  No family history on file.  Social History Social History   Tobacco Use  . Smoking status: Current Every Day Smoker    Packs/day: 1.00    Types: Cigarettes  . Smokeless tobacco: Never Used  Substance Use Topics  . Alcohol use: No  . Drug use: No    Review of Systems Constitutional: No fever/chills Eyes: No visual changes. ENT: No sore throat. Cardiovascular: Denies chest pain. Respiratory: Denies shortness of breath. Gastrointestinal: No abdominal pain.   Genitourinary: Negative for dysuria. Musculoskeletal: Negative for back pain. Skin: Negative for rash. Neurological: See HPI    ____________________________________________   PHYSICAL EXAM:  VITAL SIGNS: ED Triage Vitals  Enc Vitals Group     BP 04/14/20 2149 121/71     Pulse Rate 04/14/20 2149 70     Resp 04/14/20 2149 18     Temp 04/14/20 2149 97.8 F (36.6 C)     Temp Source 04/14/20 2149 Oral     SpO2 04/14/20 2141 98 %     Weight 04/14/20 2150 187 lb (84.8 kg)     Height 04/14/20 2150 5\' 8"  (1.727 m)     Head Circumference --      Peak Flow --      Pain Score 04/14/20 2149 3     Pain Loc --      Pain Edu? --      Excl. in GC? --     Constitutional: Alert and oriented. Well appearing and in no acute distress. Eyes: Conjunctivae are normal. Head: Atraumatic. Nose: No congestion/rhinnorhea. Mouth/Throat: Mucous membranes are moist. Neck: No stridor.  Cardiovascular: Normal rate, regular rhythm. Grossly normal heart sounds.  Good peripheral circulation. Respiratory: Normal respiratory effort.  No retractions. Lungs CTAB. Gastrointestinal: Soft and nontender. No distention. Musculoskeletal: No lower extremity tenderness nor edema. Neurologic:  Normal speech and language. No gross focal neurologic deficits are appreciated. NIHSS = 0  at 1230AM Skin:  Skin is warm, dry and intact. No rash noted. Psychiatric: Mood and affect are normal. Speech and behavior are normal.  ____________________________________________   LABS (all labs ordered are listed, but only abnormal results are displayed)  Labs Reviewed  APTT - Abnormal; Notable for the following components:      Result Value   aPTT 37 (*)    All other components within normal limits  CBC - Abnormal; Notable for the following components:   WBC 12.3 (*)    All other components within normal limits  DIFFERENTIAL - Abnormal; Notable for the following components:   Neutro Abs 8.0 (*)    All other components within normal limits  COMPREHENSIVE METABOLIC PANEL - Abnormal; Notable for the following components:   Glucose, Bld 100 (*)    Creatinine, Ser 1.25 (*)    All other components within normal limits  PROTIME-INR   ____________________________________________  EKG  ED ECG REPORT I, Sharyn Creamer, the attending physician, personally viewed and interpreted this ECG.  Date: 04/15/2020 EKG Time: 2200 Rate: 80 Rhythm: normal sinus rhythm QRS Axis: normal Intervals: normal ST/T Wave abnormalities: normal Narrative Interpretation: no evidence of acute ischemia  ____________________________________________  RADIOLOGY  MR BRAIN WO CONTRAST  Result Date: 04/15/2020 CLINICAL DATA:  Initial evaluation for acute right-sided weakness, now improved. EXAM: MRI HEAD WITHOUT CONTRAST TECHNIQUE: Multiplanar, multiecho pulse sequences of the brain and surrounding structures were obtained without intravenous contrast. COMPARISON:  Previous MRI from 02/15/2019. FINDINGS: Brain: Cerebral volume within normal limits for age. Minimal scattered T2/FLAIR hyperintensity noted within the periventricular deep white matter both cerebral hemispheres, nonspecific, but felt to be within normal limits for age. Small focus of gliosis at the high posterior right parietal lobe consistent  with a small remote ischemic infarct (series 15, image 42). Additional small remote left cerebellar infarct noted (series 16, image 55). No abnormal foci of restricted diffusion to suggest acute or subacute ischemia. Gray-white matter differentiation otherwise maintained. No encephalomalacia to suggest chronic cortical infarction elsewhere within the brain. No evidence for acute intracranial hemorrhage. Single punctate chronic microhemorrhage noted within the posterior left centrum semi ovale, of doubtful significance in isolation. No mass lesion, midline shift or mass effect. No hydrocephalus or extra-axial fluid collection. Pituitary gland suprasellar region normal. Midline structures intact. Vascular: Major intracranial vascular flow voids are well maintained. Skull and upper cervical spine: Craniocervical junction within normal limits. Bone marrow signal intensity normal. No scalp soft tissue abnormality. Sinuses/Orbits: Globes and orbital soft tissues within normal limits. Fluid signal intensity fills the left maxillary sinus, consistent with sinusitis and/or retention cyst. Moderate mucosal thickening noted throughout the ethmoidal air cells. No significant mastoid effusion. Inner ear structures grossly normal. Other: None. IMPRESSION: 1. No acute intracranial abnormality. 2. Small remote ischemic infarcts involving the high posterior right parietal lobe and left cerebellum. 3. Otherwise normal brain MRI for age. 4. Left maxillary and ethmoidal sinusitis. Electronically Signed   By: Rise Mu M.D.   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 No acute fracture or dislocation. There is mild to moderate tricompartmental degenerative arthritis, most severe within the medial compartment with joint  space narrowing and osteophyte formation. Normal overall alignment. No effusion. Vascular calcifications are seen within the posterior soft tissues. IMPRESSION: No acute fracture or dislocation. Tricompartmental degenerative arthritis. Electronically Signed   By: Helyn Numbers MD   On: 04/14/2020 22:48     Left knee imaging negative for acute fracture MRI reviewed negative for acute abnormality.  No evidence acute stroke.  Remote ischemic infarcts are noted.  Discussed with the patient as well, he does not know of this and has been told previously of concerns for a past stroke. ____________________________________________   PROCEDURES  Procedure(s) performed: None  Procedures  Critical Care performed: No  ____________________________________________   INITIAL IMPRESSION / ASSESSMENT AND PLAN / ED COURSE  Pertinent labs & imaging results that were available during my care of the patient were reviewed by me and considered in my  medical decision making (see chart for details).   Resolved right arm and right leg weakness with numbness.  Patient had complete resolution of symptoms.  Etiology somewhat unclear as he reports this is happened multiple times in the past and follows with neurology without a clear cause.  No neck pain.  He has had work-ups including a CT angiogram that only showed mild atherosclerotic disease within the last 2 years.  I am not able to access [facility] at this time.  Complete resolution of symptoms.  Reports taking baby aspirin daily at home.  Unclear as to cause, TIA is on differential, seizure, or other neurologic etiology but I am not quite sure as it seems very repetitive in nature these episodes occur.  Have placed a consult to our teleneurologist to see and evaluate for further input and disposition recommendation  Denies associated cardiac or pulmonary symptoms.  No infectious symptoms.  No headache now.     ----------------------------------------- 4:06 AM on 04/15/2020 -----------------------------------------  Discussed recommendations to add Plavix to his regimen to the patient.  We did discuss this is a fairly strong blood thinner and bleeding precaution risks or injury risk around it including head injury that he would need to return to the ER right away for evaluation.  Patient understanding and agreeable.  Reports he does not take any blood thinners other than a baby aspirin daily which he will continue  He is comfortable with careful return precautions and plan to [facility]  ____________________________________________   FINAL CLINICAL IMPRESSION(S) / ED DIAGNOSES  Final diagnoses:  Paresthesia        Note:  This document was prepared using Dragon voice recognition software and may include unintentional dictation errors       Sharyn Creamer, MD 04/15/20 0406

## 2020-04-15 NOTE — ED Notes (Signed)
Tele-neuro on screen and this RN at bedside.

## 2020-04-15 NOTE — Discharge Instructions (Addendum)
Please add plavix (newly prescribed) to your drug regimen. Follow-up closely with your neurologist within 1 week.

## 2020-04-15 NOTE — Consult Note (Signed)
[facility] TeleNeurology Consult Services  Stat Consult  Date of Service:   04/15/2020 00:55:21  Impression:     .  G45.9 - Transient cerebral ischemic attack, unspecified  Comments/Sign-Out: symptoms are concerning for possible TIA, I would start him on aspirin 81 mg a day and Plavix 75 mg a day as dual antiplatelet therapy for 3 weeks and then he would go to monotherapy with aspirin alone he is going to have an MRI of his head, if that is normal then I would feel comfortable with him completing his workup as an outpatient since he did have a normal CTA within the last 3 months. If mri is abnormal and shows evidence of acute stroke that he should have inpatient evaluation. If the MRI is unremarkable then he can start on the dual antiplatelet therapy and I recommend follow-up with neurology in a week, with consideration of that time of setting up an outpatient 30 day heart monitor to also evaluate him for intermittent atrial fibrillation as a potential source of TIA  Metrics: [facility] Notification Time: 04/15/2020 00:52:37 Stamp Time: 04/15/2020 00:55:21 Callback Response Time: 04/15/2020 00:55:59   ----------------------------------------------------------------------------------------------------  Chief Complaint: numbness  History of Present Illness: Patient is a 69 year old Male.  69 yo with numbness of his right hand tonight. The numbness lasted for about 10 minutes and then resolved. When he tried to stand , he felt that his legs were weak. subsequently the strength is returned to his legs and his right hand is no longer numb. He states that he had a "mini stroke" a year or so ago and was an aspirin for a period of time but he is not currently on any antithrombotic agents he has had recurrent headaches in the past including an evaluation in September of last year with a headache and complains of left arm and leg weakness, in episode in August where he had bilateral neck  pain and some chest pain CT of the head and neck December 30, 2019 was unremarkable.   Past Medical History:     . Stroke  Anticoagulant use:  No  Antiplatelet use: No    Examination: BP(111/73), Pulse(.), Blood Glucose(.) 1A: Level of Consciousness - Alert; keenly responsive + 0 1B: Ask Month and Age - Both Questions Right + 0 1C: Blink Eyes & Squeeze Hands - Performs Both Tasks + 0 2: Test Horizontal Extraocular Movements - Normal + 0 3: Test Visual Fields - No Visual Loss + 0 4: Test Facial Palsy (Use Grimace if Obtunded) - Normal symmetry + 0 5A: Test Left Arm Motor Drift - No Drift for 10 Seconds + 0 5B: Test Right Arm Motor Drift - No Drift for 10 Seconds + 0 6A: Test Left Leg Motor Drift - No Drift for 5 Seconds + 0 6B: Test Right Leg Motor Drift - No Drift for 5 Seconds + 0 7: Test Limb Ataxia (FNF/Heel-Shin) - No Ataxia + 0 8: Test Sensation - Normal; No sensory loss + 0 9: Test Language/Aphasia - Normal; No aphasia + 0 10: Test Dysarthria - Normal + 0 11: Test Extinction/Inattention - No abnormality + 0  NIHSS Score: 0   Patient/Family was informed the Neurology Consult would occur via TeleHealth consult by way of interactive audio and video telecommunications and consented to receiving care in this manner.  Patient is being evaluated for possible acute neurologic impairment and high probability of imminent or life-threatening deterioration. I spent total of 20 minutes providing care to this patient,  including time for face to face visit via telemedicine, review of medical records, imaging studies and discussion of findings with providers, the patient and/or family.   Dr Georgina Snell   [facility] [phone]  Case [id number]

## 2020-08-24 ENCOUNTER — Emergency Department
Admission: EM | Admit: 2020-08-24 | Discharge: 2020-08-24 | Disposition: A | Discharge status: Home / Self Care | Payer: No Typology Code available for payment source | Attending: Emergency Medicine | Admitting: Emergency Medicine

## 2020-08-24 ENCOUNTER — Emergency Department: Payer: No Typology Code available for payment source

## 2020-08-24 ENCOUNTER — Other Ambulatory Visit: Payer: Self-pay

## 2020-08-24 DIAGNOSIS — F17210 Nicotine dependence, cigarettes, uncomplicated: Secondary | ICD-10-CM | POA: Insufficient documentation

## 2020-08-24 DIAGNOSIS — Z7982 Long term (current) use of aspirin: Secondary | ICD-10-CM | POA: Insufficient documentation

## 2020-08-24 DIAGNOSIS — D72829 Elevated white blood cell count, unspecified: Secondary | ICD-10-CM | POA: Insufficient documentation

## 2020-08-24 DIAGNOSIS — R339 Retention of urine, unspecified: Secondary | ICD-10-CM | POA: Diagnosis not present

## 2020-08-24 DIAGNOSIS — E119 Type 2 diabetes mellitus without complications: Secondary | ICD-10-CM | POA: Diagnosis not present

## 2020-08-24 DIAGNOSIS — Z978 Presence of other specified devices: Secondary | ICD-10-CM

## 2020-08-24 DIAGNOSIS — N400 Enlarged prostate without lower urinary tract symptoms: Secondary | ICD-10-CM | POA: Diagnosis not present

## 2020-08-24 DIAGNOSIS — R748 Abnormal levels of other serum enzymes: Secondary | ICD-10-CM | POA: Diagnosis not present

## 2020-08-24 LAB — URINALYSIS, COMPLETE (UACMP) WITH MICROSCOPIC
Bacteria, UA: NONE SEEN
Bilirubin Urine: NEGATIVE
Glucose, UA: 50 mg/dL — AB
Ketones, ur: 5 mg/dL — AB
Leukocytes,Ua: NEGATIVE
Nitrite: NEGATIVE
Protein, ur: NEGATIVE mg/dL
RBC / HPF: >50 RBC/hpf — ABNORMAL HIGH (ref 0–5)
Specific Gravity, Urine: 1.014 (ref 1.005–1.030)
pH: 6 (ref 5.0–8.0)

## 2020-08-24 LAB — CBC WITH DIFFERENTIAL/PLATELET
Abs Immature Granulocytes: 0.08 10*3/uL — ABNORMAL HIGH (ref 0.00–0.07)
Basophils Absolute: 0 10*3/uL (ref 0.0–0.1)
Basophils Relative: 0 %
Eosinophils Absolute: 0 10*3/uL (ref 0.0–0.5)
Eosinophils Relative: 0 %
HCT: 40.4 % (ref 39.0–52.0)
Hemoglobin: 14.2 g/dL (ref 13.0–17.0)
Immature Granulocytes: 1 %
Lymphocytes Relative: 7 %
Lymphs Abs: 1.2 10*3/uL (ref 0.7–4.0)
MCH: 32.3 pg (ref 26.0–34.0)
MCHC: 35.1 g/dL (ref 30.0–36.0)
MCV: 91.8 fL (ref 80.0–100.0)
Monocytes Absolute: 0.7 10*3/uL (ref 0.1–1.0)
Monocytes Relative: 4 %
Neutro Abs: 15.4 10*3/uL — ABNORMAL HIGH (ref 1.7–7.7)
Neutrophils Relative %: 88 %
Platelets: 168 10*3/uL (ref 150–400)
RBC: 4.4 MIL/uL (ref 4.22–5.81)
RDW: 13.1 % (ref 11.5–15.5)
WBC: 17.5 10*3/uL — ABNORMAL HIGH (ref 4.0–10.5)
nRBC: 0 % (ref 0.0–0.2)

## 2020-08-24 LAB — CK
Total CK: 1300 U/L — ABNORMAL HIGH (ref 49–397)
Total CK: 1029 U/L — ABNORMAL HIGH (ref 49–397)

## 2020-08-24 LAB — URINE DRUG SCREEN, QUALITATIVE (ARMC ONLY)
Amphetamines, Ur Screen: NOT DETECTED
Barbiturates, Ur Screen: NOT DETECTED
Benzodiazepine, Ur Scrn: NOT DETECTED
Cannabinoid 50 Ng, Ur ~~LOC~~: NOT DETECTED
Cocaine Metabolite,Ur ~~LOC~~: POSITIVE — AB
MDMA (Ecstasy)Ur Screen: NOT DETECTED
Methadone Scn, Ur: NOT DETECTED
Opiate, Ur Screen: NOT DETECTED
Phencyclidine (PCP) Ur S: NOT DETECTED
Tricyclic, Ur Screen: NOT DETECTED

## 2020-08-24 LAB — COMPREHENSIVE METABOLIC PANEL
ALT: 18 U/L (ref 0–44)
AST: 47 U/L — ABNORMAL HIGH (ref 15–41)
Albumin: 4.2 g/dL (ref 3.5–5.0)
Alkaline Phosphatase: 62 U/L (ref 38–126)
Anion gap: 9 (ref 5–15)
BUN: 11 mg/dL (ref 8–23)
CO2: 22 mmol/L (ref 22–32)
Calcium: 10.1 mg/dL (ref 8.9–10.3)
Chloride: 99 mmol/L (ref 98–111)
Creatinine, Ser: 1.08 mg/dL (ref 0.61–1.24)
GFR, Estimated: >60 mL/min (ref >60)
Glucose, Bld: 144 mg/dL — ABNORMAL HIGH (ref 70–99)
Potassium: 3.8 mmol/L (ref 3.5–5.1)
Sodium: 130 mmol/L — ABNORMAL LOW (ref 135–145)
Total Bilirubin: 1.5 mg/dL — ABNORMAL HIGH (ref 0.3–1.2)
Total Protein: 7.7 g/dL (ref 6.5–8.1)

## 2020-08-24 MED ORDER — SODIUM CHLORIDE 0.9 % IV BOLUS
1000.0000 mL | Freq: Once | INTRAVENOUS | Status: AC
  Administered 2020-08-24: 1000 mL via INTRAVENOUS

## 2020-08-24 MED ORDER — CEPHALEXIN 500 MG PO CAPS: 1000.0000 mg | ORAL_CAPSULE | Freq: Once | ORAL | Status: DC

## 2020-08-24 MED ORDER — CEPHALEXIN 500 MG PO CAPS: 500.0000 mg | ORAL_CAPSULE | Freq: Four times a day (QID) | ORAL | 0 refills | Status: AC

## 2020-08-24 NOTE — ED Notes (Signed)
Pt told this RN he reached out to his sponsor and felt supported, and that he is back on track now, not looking back.

## 2020-08-24 NOTE — ED Notes (Signed)
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 States his wife has been at [facility] recovering from a triple A, came home and had to go right back, he has been under a lot of stress and states he hates that he gave in to the stress last night. This RN encouraged him to call his sponsor, and reach out for support.

## 2020-08-24 NOTE — ED Notes (Signed)
Patient transported to CT 

## 2020-08-24 NOTE — Discharge Instructions (Addendum)
Please keep Foley catheter in place.  Take antibiotics as prescribed.  Please follow-up with urology soon regarding your urinary retention.

## 2020-08-24 NOTE — ED Provider Notes (Signed)
[facility] Emergency Department Provider Note  ____________________________________________   Event Date/Time   First MD Initiated Contact with Patient 08/24/20 [phone]     (approximate)  I have reviewed the triage vital signs and the nursing notes.   HISTORY  Chief Complaint Urinary Retention  HPI Glenn Torres is a 70 y.o. male who presents to the emergency department for evaluation of acute urinary retention.  Patient states that he has not been able to really urinate a normal amount for the last 2 days, has only been making small dribbles.  Patient does report history of enlarged prostate, treated for BPH but states that he has never had this complication before.  He denies any fevers, nausea, vomiting but does report abdominal pain associated with the urge to urinate.  Of note, the patient's wife is in the hospital after treatment for a AAA, is under a lot of stress.  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  He denies any suicidal ideation, has contacted his sponsor and is stating that he is back on track.  Does not feel like he is a threat to himself or others.        Past Medical History:  Diagnosis Date  . Depression   . Diabetes mellitus without complication (HCC)   . GERD (gastroesophageal reflux disease)     There are no problems to display for this patient.   Past Surgical History:  Procedure Laterality Date  . SPLENECTOMY, TOTAL    . TRACHEOSTOMY  1974    Prior to Admission medications   Medication Sig Start Date End Date Taking? Authorizing Provider  cephALEXin (KEFLEX) 500 MG capsule Take 1 capsule (500 mg total) by mouth 4 (four) times daily for 10 days. 08/24/20 09/03/20 Yes Lucy Chrisodgers, Abbigael Detlefsen J, PA  acetaminophen (TYLENOL) 500 MG tablet Take 1,000 mg by mouth daily as needed for mild pain or headache.     [provider]  albuterol (PROVENTIL HFA;VENTOLIN HFA) 108 (90 Base) MCG/ACT inhaler Inhale  1 puff into the lungs daily as needed for wheezing or shortness of breath.     [provider]  aspirin EC 81 MG tablet Take 81 mg by mouth daily.    [provider]  budesonide-formoterol (SYMBICORT) 80-4.5 MCG/ACT inhaler Inhale 2 puffs into the lungs 2 (two) times daily.    [provider]  buPROPion (WELLBUTRIN SR) 150 MG 12 hr tablet Take 150 mg by mouth 2 (two) times daily. 07/30/16   [provider]  ibuprofen (ADVIL,MOTRIN) 200 MG tablet Take 400 mg by mouth every 6 (six) hours as needed for headache or mild pain.    [provider]  Multiple Vitamins-Minerals (MULTIVITAMIN WITH MINERALS) tablet Take 1 tablet by mouth daily.    [provider]  sertraline (ZOLOFT) 100 MG tablet Take 100 mg by mouth daily. 01/24/19   [provider]  SUMAtriptan (IMITREX) 100 MG tablet Take 100 mg by mouth every 2 (two) hours as needed for migraine. May repeat in 2 hours if headache persists or recurs.    [provider]  SUMAtriptan (IMITREX) 5 MG/ACT nasal spray Place 1 spray into the nose every 2 (two) hours as needed for migraine.     [provider]  tamsulosin (FLOMAX) 0.4 MG CAPS capsule Take 0.4 mg by mouth daily.    [provider]  tolnaftate (TINACTIN) 1 % cream Apply 1 application topically daily.     [provider]  traZODone (DESYREL) 100 MG tablet Take 100 mg by mouth at bedtime.    [provider]    Allergies Gabapentin  History reviewed. No pertinent family history.  Social History Social History   Tobacco Use  . Smoking status: Current Every Day Smoker    Packs/day: 1.00    Types: Cigarettes  . Smokeless tobacco: Never Used  Substance Use Topics  . Alcohol use: No  . Drug use: No    Review of Systems Constitutional: No fever/chills Eyes: No visual changes. ENT: No sore throat. Cardiovascular: Denies chest pain. Respiratory: Denies shortness of  breath. Gastrointestinal: No abdominal pain.  No nausea, no vomiting.  No diarrhea.  No constipation. Genitourinary: + Urinary retention Musculoskeletal: Negative for back pain. Skin: Negative for rash. Neurological: Negative for headaches, focal weakness or numbness.  ____________________________________________   PHYSICAL EXAM:  VITAL SIGNS: ED Triage Vitals [08/24/20 0816]  Enc Vitals Group     BP      Pulse      Resp      Temp      Temp src      SpO2      Weight 180 lb (81.6 kg)     Height 5\' 8"  (1.727 m)     Head Circumference      Peak Flow      Pain Score 10     Pain Loc      Pain Edu?      Excl. in GC?    Constitutional: Alert and oriented. Well appearing and in no acute distress. Eyes: Conjunctivae are normal. PERRL. EOMI. Head: Atraumatic. Nose: No congestion/rhinnorhea. Mouth/Throat: Mucous membranes are moist.  Oropharynx non-erythematous. Neck: No stridor.   Cardiovascular: Normal rate, regular rhythm. Grossly normal heart sounds.  Good peripheral circulation. Respiratory: Normal respiratory effort.  No retractions. Lungs CTAB. Gastrointestinal: Significant suprapubic pain and suprapubic distention at time of presentation.  Repeat exam after Foley placement reveals mild tenderness in the left lower quadrant and right lower quadrant that the patient describes as muscular soreness.  No guarding or rebound tenderness. Musculoskeletal: No lower extremity tenderness nor edema.  No joint effusions. Neurologic:  Normal speech and language. No gross focal neurologic deficits are appreciated. No gait instability. Skin:  Skin is warm, dry and intact. No rash noted. Psychiatric: Mood and affect are normal. Speech and behavior are normal.  ____________________________________________   LABS (all labs ordered are listed, but only abnormal results are displayed)  Labs Reviewed  CBC WITH DIFFERENTIAL/PLATELET - Abnormal; Notable for the following components:       Result Value   WBC 17.5 (*)    Neutro Abs 15.4 (*)    Abs Immature Granulocytes 0.08 (*)    All other components within normal limits  COMPREHENSIVE METABOLIC PANEL - Abnormal; Notable for the following components:   Sodium 130 (*)    Glucose, Bld 144 (*)    AST 47 (*)    Total Bilirubin 1.5 (*)    All other components within normal limits  URINALYSIS, COMPLETE (UACMP) WITH MICROSCOPIC - Abnormal; Notable for the following components:   Color, Urine YELLOW (*)    APPearance CLEAR (*)    Glucose, UA 50 (*)    Hgb urine dipstick LARGE (*)    Ketones, ur 5 (*)    RBC / HPF >50 (*)    All other components within normal limits  URINE DRUG SCREEN, QUALITATIVE ([facility] ONLY) - Abnormal; Notable for the following components:  Cocaine Metabolite,Ur [place] POSITIVE (*)    All other components within normal limits  CK - Abnormal; Notable for the following components:   Total CK 1,300 (*)    All other components within normal limits  CK - Abnormal; Notable for the following components:   Total CK 1,029 (*)    All other components within normal limits  URINE CULTURE   ____________________________________________  RADIOLOGY  Official radiology report(s): CT Renal Stone Study  Result Date: 08/24/2020 CLINICAL DATA:  Hematuria and urinary retention EXAM: CT ABDOMEN AND PELVIS WITHOUT CONTRAST TECHNIQUE: Multidetector CT imaging of the abdomen and pelvis was performed following the standard protocol without oral or IV contrast. COMPARISON:  None. FINDINGS: Lower chest: There is mild bibasilar lung atelectatic change. No lung base edema or consolidation. Hepatobiliary: There are occasional subcentimeter apparent cysts in the liver. No other liver lesions evident on this noncontrast enhanced study. Gallbladder wall is not appreciably thickened. There is no biliary duct dilatation. Pancreas: No evident pancreatic mass or inflammatory focus. Spleen: Evidence of previous splenectomy with a small amount of  residual splenic tissue present. Adrenals/Urinary Tract: Adrenals bilaterally appear normal. There is a cyst in the lower pole left kidney measuring 1.3 x 0.7 cm. There is symmetric fullness of each renal collecting system. Each ureter is mildly dilated in a symmetric manner. There is no renal or ureteral calculus on either side. There is a Foley catheter in the bladder. Moderate urine remains in the bladder. Air is present within the urinary bladder which potentially may have iatrogenic etiology. Air of this nature could also be due to gas forming infectious organism. Note that there is impression along the inferior aspect of the bladder due to a grossly enlarged prostate. Stomach/Bowel: No appreciable bowel wall or mesenteric thickening. There are scattered colonic diverticula without diverticulitis. There is no appreciable bowel obstruction. There is no free air or portal venous air. Appendix appears unremarkable. No free air or portal venous air. Vascular/Lymphatic: No abdominal aortic aneurysm. There are foci of aortic and iliac artery atherosclerotic calcification bilaterally. There is no appreciable adenopathy by size criteria in the abdomen or pelvis. Subcentimeter periprostatic lymph nodes noted. Reproductive: There is marked enlargement of the prostate. Prostate abuts the inferior wall of the bladder and may invade the bladder. There is loss of fat plane between the prostate and bladder. There is enlargement of the right seminal vesicle. The left seminal vesicle is not appear enlarged. There are several periprostatic lymph nodes, largest with short axis diameter of 8 mm. Other: No abscess or ascites in the abdomen or pelvis. Postoperative changes noted along the anterior abdominal wall. Musculoskeletal: No change noted lumbar spine and sacroiliac joint regions. No well-defined blastic or lytic bone lesions. No appreciable intramuscular lesions. IMPRESSION: 1. Markedly enlarged prostate which may invade the  inferior bladder. Prostate has a somewhat lobular contour. There is enlargement of the right seminal vesicle as well as several subcentimeter periprostatic lymph nodes. These findings are concerning for potential prostate neoplasm. Clinical assessment and PSA evaluation in this regard advised. 2. Foley catheter present within the bladder. Moderate urine remains in the bladder. Air within the bladder may have iatrogenic etiology. Gas-forming organism could present in this manner. Correlation with urinalysis advised. 3. Symmetric fullness of each renal collecting system and ureter likely due to stasis phenomenon due to bladder distension. No mass or calculi evident in either kidney or ureter. 4. No bowel wall thickening. Scattered colonic diverticula without diverticulitis. No bowel obstruction. No abscess in  the abdomen or pelvis. Appendix appears normal. 5. 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 Electronically Signed   By: Bretta Bang III M.D.   On: 08/24/2020 10:36    ____________________________________________   INITIAL IMPRESSION / ASSESSMENT AND PLAN / ED COURSE  As part of my medical decision making, I reviewed the following data within the electronic MEDICAL RECORD NUMBER Nursing notes reviewed and incorporated, Labs reviewed, Evaluated by EM attending Dr. Fuller Plan and Notes from prior ED visits        Patient is a 70 year old male who presents to the emergency department for evaluation of acute urinary retention after only dribbling for the last 2 days.  In triage, bladder scan was noted to be greater than 1000 mL and he was immediately brought back to the room for Foley placement.  Overall, the patient's vitals are within normal limits.  Initial exam reveals significant suprapubic tenderness that made significant improvement after Foley placement.  CT scan was obtained to evaluate the possible cause given no significant infection on urinalysis.  CT does  show concern over the patient's prostate for possible cancer.    Laboratory evaluation was also obtained with CBC, CMP, urinalysis, urine drug screen and urine culture.  The CK was also obtained given patient's reported muscular tenderness of the lower abdomen.  Initial CK is elevated at 1300, improved to 1029 after 2 liters of fluid.  CBC does show an elevated white count of 17.5 as well as 6-10 white cells seen on urinalysis.  Given these findings, will initiate antibiotic coverage with Keflex for possible urinary source.  The patient's urine drug screen is also positive for cocaine as he admitted to this use last night.  Overall CMP with mildly decreased sodium, corrected with 2 L NS.  After repeat evaluation, the patient is feeling much improved.  Will encourage very close follow-up with urology given his urinary retention likely secondary to prostate complication.  Will leave Foley in place until this outpatient follow-up.  Keflex initiated and sent to his pharmacy.  Patient is stable at this time for outpatient follow-up.  Patient was also seen and evaluated by Dr. Fuller Plan, who is in agreement with the plan of care.      ____________________________________________   FINAL CLINICAL IMPRESSION(S) / ED DIAGNOSES  Final diagnoses:  Urinary retention  Prostate enlargement  Foley catheter in place  Elevated CK     ED Discharge Orders         Ordered    cephALEXin (KEFLEX) 500 MG capsule  4 times daily        08/24/20 1543          *Please note:  Jebadiah Imperato was evaluated in Emergency Department on 08/24/2020 for the symptoms described in the history of present illness. He was evaluated in the context of the global COVID-19 pandemic, which necessitated consideration that the patient might be at risk for infection with the SARS-CoV-2 virus that causes COVID-19. Institutional protocols and algorithms that pertain to the evaluation of patients at risk for COVID-19 are in a state of rapid  change based on information released by regulatory bodies including the CDC and federal and state organizations. These policies and algorithms were followed during the patient's care in the ED.  Some ED evaluations and interventions may be delayed as a result of limited staffing during and the pandemic.*   Note:  This document was prepared using Dragon voice recognition software and may include unintentional dictation  errors.   Lucy Chris, PA 08/24/20 1611    Concha Se, MD 08/25/20 [phone]

## 2020-08-24 NOTE — ED Notes (Signed)
Family member at bedside.

## 2020-08-24 NOTE — ED Triage Notes (Signed)
Pt comes pov for urinary retention. Hasn't peed in 2 days. Has over in his bladder.

## 2020-08-24 NOTE — ED Notes (Signed)
Pt c/o pain at the shaft of his penis, this RN assessed area, swelling noted the size of a quarter. This RN deflated the catheter balloon with a 10cc syringe, and re- inflated to assess proper placement of catheter, pt tolerated well. Clear yellow/pink urine continues to flow from catheter to ensure foley is working properly. PA made aware.

## 2020-08-24 NOTE — ED Notes (Signed)
Pt resting in bed with eyes closed, rise and fall of chest noted, call light in reach, foley draining clear, yellow urine. Second bag of NS initiated per IV.

## 2020-08-24 NOTE — ED Notes (Signed)
PA at bedside.

## 2020-08-25 ENCOUNTER — Ambulatory Visit: Payer: Self-pay | Admitting: *Deleted

## 2020-08-25 ENCOUNTER — Emergency Department
Admission: EM | Admit: 2020-08-25 | Discharge: 2020-08-26 | Disposition: A | Discharge status: Home / Self Care | Payer: No Typology Code available for payment source | Attending: Emergency Medicine | Admitting: Emergency Medicine

## 2020-08-25 ENCOUNTER — Other Ambulatory Visit: Payer: Self-pay

## 2020-08-25 DIAGNOSIS — R339 Retention of urine, unspecified: Secondary | ICD-10-CM | POA: Diagnosis present

## 2020-08-25 DIAGNOSIS — F17210 Nicotine dependence, cigarettes, uncomplicated: Secondary | ICD-10-CM | POA: Insufficient documentation

## 2020-08-25 DIAGNOSIS — E119 Type 2 diabetes mellitus without complications: Secondary | ICD-10-CM | POA: Diagnosis not present

## 2020-08-25 DIAGNOSIS — T83098A Other mechanical complication of other indwelling urethral catheter, initial encounter: Secondary | ICD-10-CM | POA: Insufficient documentation

## 2020-08-25 DIAGNOSIS — Z7982 Long term (current) use of aspirin: Secondary | ICD-10-CM | POA: Diagnosis not present

## 2020-08-25 DIAGNOSIS — T839XXA Unspecified complication of genitourinary prosthetic device, implant and graft, initial encounter: Secondary | ICD-10-CM

## 2020-08-25 LAB — URINE CULTURE: Culture: NO GROWTH

## 2020-08-25 NOTE — Telephone Encounter (Signed)
Attempted to call patient regarding his concerns- left message to call back.(Third attempt)

## 2020-08-25 NOTE — Telephone Encounter (Signed)
Attempted to call patient back- left voice mail to return call. (Second attempt)

## 2020-08-25 NOTE — ED Provider Notes (Signed)
[facility] Emergency Department Provider Note   ____________________________________________   Event Date/Time   First MD Initiated Contact with Patient 08/25/20 2304     (approximate)  I have reviewed the triage vital signs and the nursing notes.   HISTORY  Chief Complaint Urinary Catheter Problem    HPI Glenn Torres is a 70 y.o. male who returns to the ED from home with a chief complaint of having blood-tinged urine from his Foley catheter.  Patient was seen in the ED yesterday for urinary retention.  He was found to be in rhabdomyolysis secondary to cocaine use.  Foley catheter was placed yesterday for greater than 1000 mL in the bladder. Patient does admit to accidentally pulling the Foley today.  States urine continues to drain in the Foley and has not clotted.  Denies anticoagulant use.  Denies fever, chills, chest pain, shortness of breath, abdominal pain, nausea, vomiting or diarrhea     Past Medical History:  Diagnosis Date  . Depression   . Diabetes mellitus without complication (HCC)   . GERD (gastroesophageal reflux disease)     There are no problems to display for this patient.   Past Surgical History:  Procedure Laterality Date  . SPLENECTOMY, TOTAL    . TRACHEOSTOMY  1974    Prior to Admission medications   Medication Sig Start Date End Date Taking? Authorizing Provider  acetaminophen (TYLENOL) 500 MG tablet Take 1,000 mg by mouth daily as needed for mild pain or headache.     [provider]  albuterol (PROVENTIL HFA;VENTOLIN HFA) 108 (90 Base) MCG/ACT inhaler Inhale 1 puff into the lungs daily as needed for wheezing or shortness of breath.     [provider]  aspirin EC 81 MG tablet Take 81 mg by mouth daily.    [provider]  budesonide-formoterol (SYMBICORT) 80-4.5 MCG/ACT inhaler Inhale 2 puffs into the lungs 2 (two) times daily.    [provider]  buPROPion (WELLBUTRIN SR) 150 MG 12  hr tablet Take 150 mg by mouth 2 (two) times daily. 07/30/16   [provider]  cephALEXin (KEFLEX) 500 MG capsule Take 1 capsule (500 mg total) by mouth 4 (four) times daily for 10 days. 08/24/20 09/03/20  Lucy Chris, PA  ibuprofen (ADVIL,MOTRIN) 200 MG tablet Take 400 mg by mouth every 6 (six) hours as needed for headache or mild pain.    [provider]  Multiple Vitamins-Minerals (MULTIVITAMIN WITH MINERALS) tablet Take 1 tablet by mouth daily.    [provider]  sertraline (ZOLOFT) 100 MG tablet Take 100 mg by mouth daily. 01/24/19   [provider]  SUMAtriptan (IMITREX) 100 MG tablet Take 100 mg by mouth every 2 (two) hours as needed for migraine. May repeat in 2 hours if headache persists or recurs.    [provider]  SUMAtriptan (IMITREX) 5 MG/ACT nasal spray Place 1 spray into the nose every 2 (two) hours as needed for migraine.     [provider]  tamsulosin (FLOMAX) 0.4 MG CAPS capsule Take 0.4 mg by mouth daily.    [provider]  tolnaftate (TINACTIN) 1 % cream Apply 1 application topically daily.     [provider]  traZODone (DESYREL) 100 MG tablet Take 100 mg by mouth at bedtime.    [provider]    Allergies Gabapentin  No family history on file.  Social History Social History   Tobacco Use  . Smoking status: Current Every  Day Smoker    Packs/day: 1.00    Types: Cigarettes  . Smokeless tobacco: Never Used  Substance Use Topics  . Alcohol use: No  . Drug use: No    Review of Systems  Constitutional: No fever/chills Eyes: No visual changes. ENT: No sore throat. Cardiovascular: Denies chest pain. Respiratory: Denies shortness of breath. Gastrointestinal: No abdominal pain.  No nausea, no vomiting.  No diarrhea.  No constipation. Genitourinary: Positive for blood-tinged urine coming from Foley catheter.  Negative for dysuria. Musculoskeletal: Negative for back pain. Skin:  Negative for rash. Neurological: Negative for headaches, focal weakness or numbness.   ____________________________________________   PHYSICAL EXAM:  VITAL SIGNS: ED Triage Vitals  Enc Vitals Group     BP 08/25/20 2144 126/71     Pulse Rate 08/25/20 2144 88     Resp 08/25/20 2144 20     Temp 08/25/20 2144 98.3 F (36.8 C)     Temp Source 08/25/20 2144 Oral     SpO2 08/25/20 2144 96 %     Weight 08/25/20 2148 182 lb (82.6 kg)     Height 08/25/20 2148 5\' 8"  (1.727 m)     Head Circumference --      Peak Flow --      Pain Score 08/25/20 2148 5     Pain Loc --      Pain Edu? --      Excl. in GC? --     Constitutional: Alert and oriented. Well appearing and in no acute distress. Eyes: Conjunctivae are normal. PERRL. EOMI. Head: Atraumatic. Nose: No congestion/rhinnorhea. Mouth/Throat: Mucous membranes are moist.   Neck: No stridor.   Cardiovascular: Normal rate, regular rhythm. Grossly normal heart sounds.  Good peripheral circulation. Respiratory: Normal respiratory effort.  No retractions. Lungs CTAB. Gastrointestinal: Soft and nontender to light or deep palpation. No distention. No abdominal bruits. No CVA tenderness. Genitourinary: Indwelling Foley catheter in place with blood-tinged urine, no gross hematuria. Musculoskeletal: No lower extremity tenderness nor edema.  No joint effusions. Neurologic:  Normal speech and language. No gross focal neurologic deficits are appreciated. No gait instability. Skin:  Skin is warm, dry and intact. No rash noted. Psychiatric: Mood and affect are normal. Speech and behavior are normal.  ____________________________________________   LABS (all labs ordered are listed, but only abnormal results are displayed)  Labs Reviewed  CBC WITH DIFFERENTIAL/PLATELET - Abnormal; Notable for the following components:      Result Value   WBC 11.2 (*)    RBC 4.04 (*)    HCT 38.4 (*)    All other components within normal limits  CK - Abnormal;  Notable for the following components:   Total CK 566 (*)    All other components within normal limits  URINALYSIS, COMPLETE (UACMP) WITH MICROSCOPIC - Abnormal; Notable for the following components:   Color, Urine YELLOW (*)    APPearance CLOUDY (*)    Specific Gravity, Urine 1.032 (*)    Glucose, UA 50 (*)    Hgb urine dipstick LARGE (*)    Ketones, ur 5 (*)    Protein, ur 100 (*)    RBC / HPF >50 (*)    All other components within normal limits  BASIC METABOLIC PANEL  PROTIME-INR   ____________________________________________  EKG  None ____________________________________________  RADIOLOGY I, Issabelle Mcraney J, personally viewed and evaluated these images (plain radiographs) as part of my medical decision making, as well as reviewing the written report by the radiologist.  ED MD interpretation:  None  Official radiology report(s): No results found.  ____________________________________________   PROCEDURES  Procedure(s) performed (including Critical Care):  Procedures   ____________________________________________   INITIAL IMPRESSION / ASSESSMENT AND PLAN / ED COURSE  As part of my medical decision making, I reviewed the following data within the electronic MEDICAL RECORD NUMBER Nursing notes reviewed and incorporated, Labs reviewed, Old chart reviewed and Notes from prior ED visits     70 year old male presenting with blood-tinged urine in indwelling Foley catheter placed yesterday.  Also recent rhabdomyolysis secondary to cocaine use.  Differential diagnosis includes but is not limited to hematuria secondary to rhabdomyolysis, trauma, anticoagulation, etc.  We will repeat lab work to ensure CK is downtrending.  Nursing to flush Foley catheter.  Patient already on antibiotic empirically.  Clinical Course as of 08/26/20 0246  Fri Aug 26, 2020  0124 Patient resting in no acute distress.  Laboratory results demonstrate further downtrend of CK.  Normal BMP.  Nurse tells  me Foley catheter flushed very well.  Strict return precautions given.  Patient verbalizes understanding agrees with plan of care. [JS]    Clinical Course User Index [JS] Irean Hong, MD     ____________________________________________   FINAL CLINICAL IMPRESSION(S) / ED DIAGNOSES  Final diagnoses:  Foley catheter problem, initial encounter [facility])     ED Discharge Orders    None      *Please note:  Glenn Torres was evaluated in Emergency Department on 08/26/2020 for the symptoms described in the history of present illness. He was evaluated in the context of the global COVID-19 pandemic, which necessitated consideration that the patient might be at risk for infection with the SARS-CoV-2 virus that causes COVID-19. Institutional protocols and algorithms that pertain to the evaluation of patients at risk for COVID-19 are in a state of rapid change based on information released by regulatory bodies including the CDC and federal and state organizations. These policies and algorithms were followed during the patient's care in the ED.  Some ED evaluations and interventions may be delayed as a result of limited staffing during and the pandemic.*   Note:  This document was prepared using Dragon voice recognition software and may include unintentional dictation errors.   Irean Hong, MD 08/26/20 [phone]

## 2020-08-25 NOTE — Telephone Encounter (Signed)
Glenn Torres Thu 08/25/2020 3:47 PM Hello Erskine Squibb,     I currently have [name] [phone]) on my other line with questions regarding his emergency room visit. I see in his chart that you have called attempted 3 times to reach him. While he is anxious to find out what to do next, I'm sure he would love to speak to you but indicated his phone is not ringing. Do you have a contact number I can call and transfer him to you?  Patient returned call while I was on break- I did try to call him back- but still got VM- did leave a message for him to call back- may speak with any nurse available.

## 2020-08-25 NOTE — ED Notes (Signed)
Foley catheter irrigated at this time.  Pt tolerated irrigation well.  No blood clots or resistance noted when flushing foley or pulling back.  Dr. Dolores Frame made aware.

## 2020-08-25 NOTE — Telephone Encounter (Addendum)
Patient was seen [facility] ED 08/24/20   Patient has concerns related to a catheter they were directed to leave inserted for 10 days   Patient is on antibiotics and concerned with their interacting with the catheter   Please contact to further advise   Patient has reached out to the [place] for assistance from the PCP but been unsuccessful so far   Attempted to call patient to discuss concerns- left message on VM to call back.

## 2020-08-25 NOTE — ED Triage Notes (Signed)
Pt states he was seen here yesterday for urinary retention and had a catheter placed. Pt is now having blood tinged urine come out and blood around tip of penis. Pt states he is also having pain in penis and scrotum.

## 2020-08-26 LAB — CBC WITH DIFFERENTIAL/PLATELET
Abs Immature Granulocytes: 0.04 10*3/uL (ref 0.00–0.07)
Basophils Absolute: 0 10*3/uL (ref 0.0–0.1)
Basophils Relative: 0 %
Eosinophils Absolute: 0.2 10*3/uL (ref 0.0–0.5)
Eosinophils Relative: 2 %
HCT: 38.4 % — ABNORMAL LOW (ref 39.0–52.0)
Hemoglobin: 13.4 g/dL (ref 13.0–17.0)
Immature Granulocytes: 0 %
Lymphocytes Relative: 29 %
Lymphs Abs: 3.2 10*3/uL (ref 0.7–4.0)
MCH: 33.2 pg (ref 26.0–34.0)
MCHC: 34.9 g/dL (ref 30.0–36.0)
MCV: 95 fL (ref 80.0–100.0)
Monocytes Absolute: 0.9 10*3/uL (ref 0.1–1.0)
Monocytes Relative: 8 %
Neutro Abs: 6.8 10*3/uL (ref 1.7–7.7)
Neutrophils Relative %: 61 %
Platelets: 171 10*3/uL (ref 150–400)
RBC: 4.04 MIL/uL — ABNORMAL LOW (ref 4.22–5.81)
RDW: 13.2 % (ref 11.5–15.5)
WBC: 11.2 10*3/uL — ABNORMAL HIGH (ref 4.0–10.5)
nRBC: 0 % (ref 0.0–0.2)

## 2020-08-26 LAB — BASIC METABOLIC PANEL
Anion gap: 6 (ref 5–15)
BUN: 20 mg/dL (ref 8–23)
CO2: 23 mmol/L (ref 22–32)
Calcium: 10 mg/dL (ref 8.9–10.3)
Chloride: 108 mmol/L (ref 98–111)
Creatinine, Ser: 1.24 mg/dL (ref 0.61–1.24)
GFR, Estimated: >60 mL/min (ref >60)
Glucose, Bld: 84 mg/dL (ref 70–99)
Potassium: 3.8 mmol/L (ref 3.5–5.1)
Sodium: 137 mmol/L (ref 135–145)

## 2020-08-26 LAB — URINALYSIS, COMPLETE (UACMP) WITH MICROSCOPIC
Bacteria, UA: NONE SEEN
Bilirubin Urine: NEGATIVE
Glucose, UA: 50 mg/dL — AB
Ketones, ur: 5 mg/dL — AB
Leukocytes,Ua: NEGATIVE
Nitrite: NEGATIVE
Protein, ur: 100 mg/dL — AB
RBC / HPF: >50 RBC/hpf — ABNORMAL HIGH (ref 0–5)
Specific Gravity, Urine: 1.032 — ABNORMAL HIGH (ref 1.005–1.030)
Squamous Epithelial / HPF: NONE SEEN (ref 0–5)
pH: 5 (ref 5.0–8.0)

## 2020-08-26 LAB — PROTIME-INR
INR: 1 (ref 0.8–1.2)
Prothrombin Time: 13 seconds (ref 11.4–15.2)

## 2020-08-26 LAB — CK: Total CK: 566 U/L — ABNORMAL HIGH (ref 49–397)

## 2020-08-26 NOTE — Discharge Instructions (Addendum)
Continue and finish antibiotics as previously prescribed.  Drink plenty of fluids daily.  Return to the ER for worsening symptoms, persistent vomiting, fever or other concerns.

## 2020-09-29 ENCOUNTER — Emergency Department
Admission: EM | Admit: 2020-09-29 | Discharge: 2020-09-29 | Disposition: A | Discharge status: Home / Self Care | Payer: No Typology Code available for payment source | Attending: Emergency Medicine | Admitting: Emergency Medicine

## 2020-09-29 ENCOUNTER — Encounter: Payer: Self-pay | Admitting: Emergency Medicine

## 2020-09-29 ENCOUNTER — Other Ambulatory Visit: Payer: Self-pay

## 2020-09-29 DIAGNOSIS — R339 Retention of urine, unspecified: Secondary | ICD-10-CM | POA: Diagnosis not present

## 2020-09-29 DIAGNOSIS — E119 Type 2 diabetes mellitus without complications: Secondary | ICD-10-CM | POA: Insufficient documentation

## 2020-09-29 DIAGNOSIS — F17210 Nicotine dependence, cigarettes, uncomplicated: Secondary | ICD-10-CM | POA: Diagnosis not present

## 2020-09-29 DIAGNOSIS — Z7982 Long term (current) use of aspirin: Secondary | ICD-10-CM | POA: Insufficient documentation

## 2020-09-29 DIAGNOSIS — R310 Gross hematuria: Secondary | ICD-10-CM | POA: Insufficient documentation

## 2020-09-29 DIAGNOSIS — Z79899 Other long term (current) drug therapy: Secondary | ICD-10-CM | POA: Diagnosis not present

## 2020-09-29 LAB — BASIC METABOLIC PANEL
Anion gap: 8 (ref 5–15)
BUN: 17 mg/dL (ref 8–23)
CO2: 21 mmol/L — ABNORMAL LOW (ref 22–32)
Calcium: 10 mg/dL (ref 8.9–10.3)
Chloride: 108 mmol/L (ref 98–111)
Creatinine, Ser: 1.16 mg/dL (ref 0.61–1.24)
GFR, Estimated: >60 mL/min (ref >60)
Glucose, Bld: 107 mg/dL — ABNORMAL HIGH (ref 70–99)
Potassium: 3.9 mmol/L (ref 3.5–5.1)
Sodium: 137 mmol/L (ref 135–145)

## 2020-09-29 LAB — URINALYSIS, COMPLETE (UACMP) WITH MICROSCOPIC
Bacteria, UA: NONE SEEN
RBC / HPF: >50 RBC/hpf (ref 0–5)
Specific Gravity, Urine: 1.016 (ref 1.005–1.030)
Squamous Epithelial / HPF: NONE SEEN (ref 0–5)
WBC, UA: >50 WBC/hpf (ref 0–5)

## 2020-09-29 LAB — CBC
HCT: 41.5 % (ref 39.0–52.0)
Hemoglobin: 14 g/dL (ref 13.0–17.0)
MCH: 32 pg (ref 26.0–34.0)
MCHC: 33.7 g/dL (ref 30.0–36.0)
MCV: 95 fL (ref 80.0–100.0)
Platelets: 188 10*3/uL (ref 150–400)
RBC: 4.37 MIL/uL (ref 4.22–5.81)
RDW: 13.4 % (ref 11.5–15.5)
WBC: 8.2 10*3/uL (ref 4.0–10.5)
nRBC: 0 % (ref 0.0–0.2)

## 2020-09-29 LAB — SAMPLE TO BLOOD BANK

## 2020-09-29 MED ORDER — CEPHALEXIN 500 MG PO CAPS
500.0000 mg | ORAL_CAPSULE | Freq: Once | ORAL | Status: AC
  Administered 2020-09-29: 500 mg via ORAL
  Filled 2020-09-29: qty 1

## 2020-09-29 MED ORDER — CEPHALEXIN 500 MG PO CAPS: 500.0000 mg | ORAL_CAPSULE | Freq: Two times a day (BID) | ORAL | 0 refills | Status: DC

## 2020-09-29 NOTE — ED Notes (Signed)
Assumed care of pt upon being roomed. Post void bladder scan was >400cc. Foley placed, pt tolerated well. ~500cc drained on insertion. Aox4, talking in full sentences with regular and unlabored breathing

## 2020-09-29 NOTE — ED Provider Notes (Signed)
-----------------------------------------   7:08 AM on 09/29/2020 -----------------------------------------  Blood pressure 117/70, pulse 67, temperature 98.5 F (36.9 C), temperature source Oral, resp. rate 16, height 5\' 8"  (1.727 m), weight 82.1 kg, SpO2 99 %.  Assuming care from Dr. .  In short, Glenn Torres is a 70 y.o. male with a chief complaint of Hematuria .  Refer to the original H&P for additional details.  The current plan of care is to reassess ability to urinate following placement of foley catheter after hematuria and urinary retention.  ----------------------------------------- 8:32 AM on 09/29/2020 -----------------------------------------  Foley catheter was irrigated with urine now a light pink color and flowing freely.  Patient reports improvement in his abdominal discomfort and he is appropriate for discharge home with urology follow-up.  He will be prescribed Keflex per Dr. 11/29/2020 and he was counseled to return to the ED for new worsening symptoms, patient agrees with plan.    Elesa Massed, MD 09/29/20 [phone]

## 2020-09-29 NOTE — ED Provider Notes (Signed)
[facility] Emergency Department Provider Note  ____________________________________________   Event Date/Time   First MD Initiated Contact with Patient 09/29/20 [phone]     (approximate)  I have reviewed the triage vital signs and the nursing notes.   HISTORY  Chief Complaint Hematuria    HPI Glenn Torres is a 70 y.o. male with history of diabetes, depression, previous urinary retention requiring Foley catheter who presents to the emergency department with complaints of hematuria and urinary retention.  States yesterday after mowing the lawn he noticed a small amount of blood in his urine and had mild dysuria.  States over the course of the night he has been peeing more blind and then started having urinary retention.  He states on the way here he felt like he had to urinate severely and was able to urinate here and passed large clot.  States now his urine is clearing up and he feels like he is emptying his bladder.  No abdominal pain, flank pain.  No history of kidney stones, prostate or bladder cancer.  He is not on blood thinners.  He denies fevers, cough, vomiting or diarrhea.  No injury that he can recall.        Past Medical History:  Diagnosis Date  . Depression   . Diabetes mellitus without complication (HCC)   . GERD (gastroesophageal reflux disease)     There are no problems to display for this patient.   Past Surgical History:  Procedure Laterality Date  . SPLENECTOMY, TOTAL    . TRACHEOSTOMY  1974    Prior to Admission medications   Medication Sig Start Date End Date Taking? Authorizing Provider  cephALEXin (KEFLEX) 500 MG capsule Take 1 capsule (500 mg total) by mouth 2 (two) times daily. 09/29/20  Yes Shawnita Krizek, Layla Maw, DO  acetaminophen (TYLENOL) 500 MG tablet Take 1,000 mg by mouth daily as needed for mild pain or headache.     [provider]  albuterol (PROVENTIL HFA;VENTOLIN HFA) 108 (90 Base) MCG/ACT inhaler Inhale 1 puff  into the lungs daily as needed for wheezing or shortness of breath.     [provider]  aspirin EC 81 MG tablet Take 81 mg by mouth daily.    [provider]  budesonide-formoterol (SYMBICORT) 80-4.5 MCG/ACT inhaler Inhale 2 puffs into the lungs 2 (two) times daily.    [provider]  buPROPion (WELLBUTRIN SR) 150 MG 12 hr tablet Take 150 mg by mouth 2 (two) times daily. 07/30/16   [provider]  ibuprofen (ADVIL,MOTRIN) 200 MG tablet Take 400 mg by mouth every 6 (six) hours as needed for headache or mild pain.    [provider]  Multiple Vitamins-Minerals (MULTIVITAMIN WITH MINERALS) tablet Take 1 tablet by mouth daily.    [provider]  sertraline (ZOLOFT) 100 MG tablet Take 100 mg by mouth daily. 01/24/19   [provider]  SUMAtriptan (IMITREX) 100 MG tablet Take 100 mg by mouth every 2 (two) hours as needed for migraine. May repeat in 2 hours if headache persists or recurs.    [provider]  SUMAtriptan (IMITREX) 5 MG/ACT nasal spray Place 1 spray into the nose every 2 (two) hours as needed for migraine.     [provider]  tamsulosin (FLOMAX) 0.4 MG CAPS capsule Take 0.4 mg by mouth daily.    [provider]  tolnaftate (TINACTIN) 1 % cream Apply 1 application topically daily.     [provider]  traZODone (DESYREL) 100 MG tablet Take 100 mg by mouth at bedtime.    [provider]    Allergies Gabapentin  History reviewed. No pertinent family history.  Social History Social History   Tobacco Use  . Smoking status: Current Every Day Smoker    Packs/day: 1.00    Types: Cigarettes  . Smokeless tobacco: Never Used  Substance Use Topics  . Alcohol use: No  . Drug use: No    Review of Systems Constitutional: No fever. Eyes: No visual changes. ENT: No sore throat. Cardiovascular: Denies chest pain. Respiratory: Denies shortness of breath. Gastrointestinal: No  nausea, vomiting, diarrhea. Genitourinary: + for dysuria. Musculoskeletal: Negative for back pain. Skin: Negative for rash. Neurological: Negative for focal weakness or numbness.  ____________________________________________   PHYSICAL EXAM:  VITAL SIGNS: ED Triage Vitals  Enc Vitals Group     BP 09/29/20 0545 116/81     Pulse Rate 09/29/20 0545 75     Resp 09/29/20 0545 16     Temp 09/29/20 0545 98.5 F (36.9 C)     Temp Source 09/29/20 0545 Oral     SpO2 09/29/20 0545 100 %     Weight 09/29/20 0546 181 lb (82.1 kg)     Height 09/29/20 0546 5\' 8"  (1.727 m)     Head Circumference --      Peak Flow --      Pain Score 09/29/20 0546 6     Pain Loc --      Pain Edu? --      Excl. in GC? --    CONSTITUTIONAL: Alert and oriented and responds appropriately to questions. Well-appearing; well-nourished HEAD: Normocephalic EYES: Conjunctivae clear, pupils appear equal, EOM appear intact ENT: normal nose; moist mucous membranes NECK: Supple, normal ROM CARD: RRR; S1 and S2 appreciated; no murmurs, no clicks, no rubs, no gallops RESP: Normal chest excursion without splinting or tachypnea; breath sounds clear and equal bilaterally; no wheezes, no rhonchi, no rales, no hypoxia or respiratory distress, speaking full sentences ABD/GI: Normal bowel sounds; non-distended; soft, non-tender, no rebound, no guarding, no peritoneal signs, no hepatosplenomegaly GU:  Normal external genitalia, circumcised male, normal penile shaft, no blood or discharge at the urethral meatus, no testicular masses or tenderness on exam, no scrotal masses or swelling, no hernias appreciated, 2+ femoral pulses bilaterally; no perineal erythema, warmth, subcutaneous air or crepitus; no high riding testicle, normal bilateral cremasteric reflex.  Chaperone present for exam. BACK: The back appears normal EXT: Normal ROM in all joints; no deformity noted, no edema; no cyanosis SKIN: Normal color for age and race; warm; no  rash on exposed skin NEURO: Moves all extremities equally PSYCH: The patient's mood and manner are appropriate.  ____________________________________________   LABS (all labs ordered are listed, but only abnormal results are displayed)  Labs Reviewed  URINALYSIS, COMPLETE (UACMP) WITH MICROSCOPIC - Abnormal; Notable for the following components:      Result Value   Color, Urine RED (*)    APPearance CLOUDY (*)    Glucose, UA   (*)    Value: TEST NOT REPORTED DUE TO COLOR INTERFERENCE OF URINE PIGMENT   Hgb urine dipstick   (*)    Value: TEST NOT REPORTED DUE TO COLOR INTERFERENCE OF URINE PIGMENT   Bilirubin Urine   (*)    Value: TEST NOT REPORTED DUE TO COLOR INTERFERENCE OF URINE PIGMENT   Ketones, ur   (*)    Value: TEST NOT REPORTED DUE TO COLOR INTERFERENCE  OF URINE PIGMENT   Protein, ur   (*)    Value: TEST NOT REPORTED DUE TO COLOR INTERFERENCE OF URINE PIGMENT   Nitrite   (*)    Value: TEST NOT REPORTED DUE TO COLOR INTERFERENCE OF URINE PIGMENT   Leukocytes,Ua   (*)    Value: TEST NOT REPORTED DUE TO COLOR INTERFERENCE OF URINE PIGMENT   All other components within normal limits  BASIC METABOLIC PANEL - Abnormal; Notable for the following components:   CO2 21 (*)    Glucose, Bld 107 (*)    All other components within normal limits  URINE CULTURE  CBC  SAMPLE TO BLOOD BANK   ____________________________________________  EKG   ____________________________________________  RADIOLOGY I, Yunis Voorheis, personally viewed and evaluated these images (plain radiographs) as part of my medical decision making, as well as reviewing the written report by the radiologist.  ED MD interpretation:    Official radiology report(s): No results found.  ____________________________________________   PROCEDURES  Procedure(s) performed (including Critical Care):  Procedures    ____________________________________________   INITIAL IMPRESSION / ASSESSMENT AND PLAN /  ED COURSE  As part of my medical decision making, I reviewed the following data within the electronic MEDICAL RECORD NUMBER Nursing notes reviewed and incorporated, Labs reviewed , Old chart reviewed and Notes from prior ED visits         Patient here with urinary retention, gross hematuria.  Reports hematuria improving and states he has been able to urinate but still has greater than 400 mL in his bladder on bladder scan.  Will place Foley catheter due to urinary retention.  Labs, urine, culture pending.  Doubt kidney stone given no pain at this time.  No history of bladder or renal cancer.  Not on blood thinners.  No history of trauma.  He reports his urine is already clearing spontaneously.  He has had previous urinary retention and has a urologist for follow-up.  ED PROGRESS  Patient's urine shows greater than 50 white blood cells and greater than 50 red blood cells but no bacteria.  Culture is pending.  We will place him on antibiotics for possible infectious etiology.  He reports feeling better after Foley catheter placement but still has gross blood without clots.  Will irrigate Foley catheter to ensure clearance.  He has urologist for follow-up.  Signed out the oncoming ED physician to reassess patient but anticipate discharge home.  I reviewed all nursing notes and pertinent previous records as available.  I have reviewed and interpreted any EKGs, lab and urine results, imaging (as available).  ____________________________________________   FINAL CLINICAL IMPRESSION(S) / ED DIAGNOSES  Final diagnoses:  Urinary retention  Gross hematuria     ED Discharge Orders         Ordered    cephALEXin (KEFLEX) 500 MG capsule  2 times daily        09/29/20 0645          *Please note:  Tuck Dulworth was evaluated in Emergency Department on 09/29/2020 for the symptoms described in the history of present illness. He was evaluated in the context of the global COVID-19 pandemic, which  necessitated consideration that the patient might be at risk for infection with the SARS-CoV-2 virus that causes COVID-19. Institutional protocols and algorithms that pertain to the evaluation of patients at risk for COVID-19 are in a state of rapid change based on information released by regulatory bodies including the CDC and federal and state organizations. These policies  and algorithms were followed during the patient's care in the ED.  Some ED evaluations and interventions may be delayed as a result of limited staffing during and the pandemic.*   Note:  This document was prepared using Dragon voice recognition software and may include unintentional dictation errors.   Carrolyn Hilmes, Layla Maw, DO 09/29/20 [phone]

## 2020-09-29 NOTE — ED Triage Notes (Signed)
Pt to ED from home c/o hematuria and blood clots that started around 1700 yesterday and getting worse.  States unable to void at this time.  Pain and fullness across abd.  Denies n/v/d.  Had foley catheter 2 months ago for same.  Pt has urology appointments today at [facility] but couldn't wait.

## 2020-09-29 NOTE — ED Notes (Signed)
Urinary catheter irrigated with sterile water. 40 cc instilled, 75 cc light red, clear drained. 50 cc instilled, 60 cc light pink, clear drained.

## 2020-09-30 LAB — URINE CULTURE: Culture: NO GROWTH

## 2021-02-02 ENCOUNTER — Other Ambulatory Visit: Payer: Self-pay

## 2021-02-02 ENCOUNTER — Emergency Department
Admission: EM | Admit: 2021-02-02 | Discharge: 2021-02-02 | Disposition: A | Discharge status: Home / Self Care | Payer: No Typology Code available for payment source | Attending: Emergency Medicine | Admitting: Emergency Medicine

## 2021-02-02 DIAGNOSIS — R29898 Other symptoms and signs involving the musculoskeletal system: Secondary | ICD-10-CM

## 2021-02-02 DIAGNOSIS — R531 Weakness: Secondary | ICD-10-CM | POA: Insufficient documentation

## 2021-02-02 DIAGNOSIS — F17210 Nicotine dependence, cigarettes, uncomplicated: Secondary | ICD-10-CM | POA: Diagnosis not present

## 2021-02-02 DIAGNOSIS — E119 Type 2 diabetes mellitus without complications: Secondary | ICD-10-CM | POA: Diagnosis not present

## 2021-02-02 DIAGNOSIS — Z7982 Long term (current) use of aspirin: Secondary | ICD-10-CM | POA: Insufficient documentation

## 2021-02-02 HISTORY — DX: Unspecified convulsions: R56.9

## 2021-02-02 LAB — CBC
HCT: 41.9 % (ref 39.0–52.0)
Hemoglobin: 14.6 g/dL (ref 13.0–17.0)
MCH: 33.1 pg (ref 26.0–34.0)
MCHC: 34.8 g/dL (ref 30.0–36.0)
MCV: 95 fL (ref 80.0–100.0)
Platelets: 179 10*3/uL (ref 150–400)
RBC: 4.41 MIL/uL (ref 4.22–5.81)
RDW: 13.1 % (ref 11.5–15.5)
WBC: 10.5 10*3/uL (ref 4.0–10.5)
nRBC: 0 % (ref 0.0–0.2)

## 2021-02-02 LAB — BASIC METABOLIC PANEL
Anion gap: 7 (ref 5–15)
BUN: 15 mg/dL (ref 8–23)
CO2: 25 mmol/L (ref 22–32)
Calcium: 9.9 mg/dL (ref 8.9–10.3)
Chloride: 104 mmol/L (ref 98–111)
Creatinine, Ser: 1.27 mg/dL — ABNORMAL HIGH (ref 0.61–1.24)
GFR, Estimated: >60 mL/min (ref >60)
Glucose, Bld: 101 mg/dL — ABNORMAL HIGH (ref 70–99)
Potassium: 4.3 mmol/L (ref 3.5–5.1)
Sodium: 136 mmol/L (ref 135–145)

## 2021-02-02 LAB — URINALYSIS, COMPLETE (UACMP) WITH MICROSCOPIC
Bacteria, UA: NONE SEEN
Bilirubin Urine: NEGATIVE
Glucose, UA: NEGATIVE mg/dL
Hgb urine dipstick: NEGATIVE
Ketones, ur: NEGATIVE mg/dL
Leukocytes,Ua: NEGATIVE
Nitrite: NEGATIVE
Protein, ur: NEGATIVE mg/dL
Specific Gravity, Urine: 1.025 (ref 1.005–1.030)
pH: 6.5 (ref 5.0–8.0)

## 2021-02-02 MED ORDER — LEVETIRACETAM 500 MG PO TABS
500.0000 mg | ORAL_TABLET | Freq: Once | ORAL | Status: AC
  Administered 2021-02-02: 500 mg via ORAL
  Filled 2021-02-02: qty 1

## 2021-02-02 NOTE — ED Notes (Signed)
Pt ambulated to the restroom for a urine specimen with this RN as a standby assist. Urine collected, labeled at the bedside, and sent to lab.

## 2021-02-02 NOTE — ED Notes (Signed)
E-signature pad unavailable - Pt verbalized understanding of D/C information - no additional concerns at this time.  

## 2021-02-02 NOTE — ED Provider Notes (Signed)
[facility] Emergency Department Provider Note   ____________________________________________   Event Date/Time   First MD Initiated Contact with Patient 02/02/21 2020     (approximate)  I have reviewed the triage vital signs and the nursing notes.   HISTORY  Chief Complaint Weakness    HPI Glenn Torres is a 70 y.o. male with below stated past medical history presents for bilateral lower extremity weakness  LOCATION: Bilateral lower extremities DURATION: Approximately 12 hours prior to arrival TIMING: Resolved since onset SEVERITY: Moderate QUALITY: Weakness CONTEXT: Patient states that when he woke this morning he "felt like my feet are in concrete" for approximately first 2 hours after waking up.  Patient was concerned as he states these symptoms usually proceed a seizure. MODIFYING FACTORS: Denies any exacerbating or relieving factors ASSOCIATED SYMPTOMS: Denies   Per medical record review, patient has history of seizure disorder          Past Medical History:  Diagnosis Date   Depression    Diabetes mellitus without complication (HCC)    GERD (gastroesophageal reflux disease)    Seizures (HCC)     There are no problems to display for this patient.   Past Surgical History:  Procedure Laterality Date   SPLENECTOMY, TOTAL     TRACHEOSTOMY  1974    Prior to Admission medications   Medication Sig Start Date End Date Taking? Authorizing Provider  acetaminophen (TYLENOL) 500 MG tablet Take 1,000 mg by mouth daily as needed for mild pain or headache.     [provider]  albuterol (PROVENTIL HFA;VENTOLIN HFA) 108 (90 Base) MCG/ACT inhaler Inhale 1 puff into the lungs daily as needed for wheezing or shortness of breath.     [provider]  aspirin EC 81 MG tablet Take 81 mg by mouth daily.    [provider]  budesonide-formoterol (SYMBICORT) 80-4.5 MCG/ACT inhaler Inhale 2 puffs into the lungs 2 (two)  times daily.    [provider]  buPROPion (WELLBUTRIN SR) 150 MG 12 hr tablet Take 150 mg by mouth 2 (two) times daily. 07/30/16   [provider]  cephALEXin (KEFLEX) 500 MG capsule Take 1 capsule (500 mg total) by mouth 2 (two) times daily. 09/29/20   Ward, Layla Maw, DO  ibuprofen (ADVIL,MOTRIN) 200 MG tablet Take 400 mg by mouth every 6 (six) hours as needed for headache or mild pain.    [provider]  Multiple Vitamins-Minerals (MULTIVITAMIN WITH MINERALS) tablet Take 1 tablet by mouth daily.    [provider]  sertraline (ZOLOFT) 100 MG tablet Take 100 mg by mouth daily. 01/24/19   [provider]  SUMAtriptan (IMITREX) 100 MG tablet Take 100 mg by mouth every 2 (two) hours as needed for migraine. May repeat in 2 hours if headache persists or recurs.    [provider]  SUMAtriptan (IMITREX) 5 MG/ACT nasal spray Place 1 spray into the nose every 2 (two) hours as needed for migraine.     [provider]  tamsulosin (FLOMAX) 0.4 MG CAPS capsule Take 0.4 mg by mouth daily.    [provider]  tolnaftate (TINACTIN) 1 % cream Apply 1 application topically daily.     [provider]  traZODone (DESYREL) 100 MG tablet Take 100 mg by mouth at bedtime.    [provider]    Allergies Gabapentin  No family history on file.  Social History Social History   Tobacco Use   Smoking status: Every  Day    Packs/day: 1.00    Types: Cigarettes   Smokeless tobacco: Never  Substance Use Topics   Alcohol use: No   Drug use: No    Review of Systems Constitutional: No fever/chills Eyes: No visual changes. ENT: No sore throat. Cardiovascular: Denies chest pain. Respiratory: Denies shortness of breath. Gastrointestinal: No abdominal pain.  No nausea, no vomiting.  No diarrhea. Genitourinary: Negative for dysuria. Musculoskeletal: Negative for acute arthralgias Skin: Negative for rash. Neurological: Endorses  bilateral lower extremity weakness that is resolved.  Negative for headaches, numbness/paresthesias in any extremity Psychiatric: Negative for suicidal ideation/homicidal ideation   ____________________________________________   PHYSICAL EXAM:  VITAL SIGNS: ED Triage Vitals  Enc Vitals Group     BP 02/02/21 1808 115/73     Pulse Rate 02/02/21 1808 70     Resp 02/02/21 1807 20     Temp 02/02/21 1807 98 F (36.7 C)     Temp Source 02/02/21 1807 Oral     SpO2 02/02/21 1808 97 %     Weight 02/02/21 1807 187 lb (84.8 kg)     Height 02/02/21 1807 5\' 8"  (1.727 m)     Head Circumference --      Peak Flow --      Pain Score 02/02/21 1807 0     Pain Loc --      Pain Edu? --      Excl. in GC? --    Constitutional: Alert and oriented. Well appearing and in no acute distress. Eyes: Conjunctivae are normal. PERRL. Head: Atraumatic. Nose: No congestion/rhinnorhea. Mouth/Throat: Mucous membranes are moist. Neck: No stridor Cardiovascular: Grossly normal heart sounds.  Good peripheral circulation. Respiratory: Normal respiratory effort.  No retractions. Gastrointestinal: Soft and nontender. No distention. Musculoskeletal: No obvious deformities Neurologic:  Normal speech and language. No gross focal neurologic deficits are appreciated. Skin:  Skin is warm and dry. No rash noted. Psychiatric: Mood and affect are normal. Speech and behavior are normal.  ____________________________________________   LABS (all labs ordered are listed, but only abnormal results are displayed)  Labs Reviewed  BASIC METABOLIC PANEL - Abnormal; Notable for the following components:      Result Value   Glucose, Bld 101 (*)    Creatinine, Ser 1.27 (*)    All other components within normal limits  CBC  URINALYSIS, COMPLETE (UACMP) WITH MICROSCOPIC   ____________________________________________  EKG  ED ECG REPORT I, 04/04/21, the attending physician, personally viewed and interpreted this  ECG.  Date: 02/02/2021 EKG Time: 1814 Rate: 73 Rhythm: normal sinus rhythm QRS Axis: normal Intervals: normal ST/T Wave abnormalities: normal Narrative Interpretation: no evidence of acute ischemia  PROCEDURES  Procedure(s) performed (including Critical Care):  .1-3 Lead EKG Interpretation Performed by: 04/04/2021, MD Authorized by: Merwyn Katos, MD     Interpretation: normal     ECG rate:  76   ECG rate assessment: normal     Rhythm: sinus rhythm     Ectopy: none     Conduction: normal     ____________________________________________   INITIAL IMPRESSION / ASSESSMENT AND PLAN / ED COURSE  As part of my medical decision making, I reviewed the following data within the electronic medical record, if available:  Nursing notes reviewed and incorporated, Labs reviewed, EKG interpreted, Old chart reviewed, Radiograph reviewed and Notes from prior ED visits reviewed and incorporated        Patient is 71 year old male who presents for bilateral lower extremity weakness that  began this morning and lasted approximately 2 hours and resolved prior to my evaluation.  Patient has no symptomatology and neurologic exam shows no evidence of acute abnormalities.  Given history, physical exam, and laboratory evaluation, I have low suspicion for acute spinal injury, cauda equina syndrome, acute stroke, or spinal compression  Despite patient stating that these symptoms usually precede any seizure activity, patient has had no seizure activity today and continues taking his Keppra on time and as prescribed.  Patient states that he has follow-up with his neurologist within the next week at the [place] and feels comfortable with discharge at this time and return return precautions discussed  Dispo: Discharge home with neurology follow-up     ____________________________________________   FINAL CLINICAL IMPRESSION(S) / ED DIAGNOSES  Final diagnoses:  Bilateral leg weakness     ED  Discharge Orders     None        Note:  This document was prepared using Dragon voice recognition software and may include unintentional dictation errors.    Merwyn Katos, MD 02/02/21 [phone]

## 2021-02-02 NOTE — ED Triage Notes (Signed)
Pt to ED [facility] from home for sudden weakness in both lower legs. Hx seizures, states normally has these sx before seizures. Just had seizure meds changed.  Last seizure march  Also reports headaches for past 3 days, hx of cluster migraines 20g to [facility]

## 2021-11-17 ENCOUNTER — Emergency Department: Payer: No Typology Code available for payment source

## 2021-11-17 ENCOUNTER — Emergency Department
Admission: EM | Admit: 2021-11-17 | Discharge: 2021-11-17 | Disposition: A | Discharge status: Home / Self Care | Payer: No Typology Code available for payment source | Attending: Emergency Medicine | Admitting: Emergency Medicine

## 2021-11-17 ENCOUNTER — Other Ambulatory Visit: Payer: Self-pay

## 2021-11-17 DIAGNOSIS — R531 Weakness: Secondary | ICD-10-CM | POA: Diagnosis present

## 2021-11-17 DIAGNOSIS — R251 Tremor, unspecified: Secondary | ICD-10-CM | POA: Diagnosis not present

## 2021-11-17 DIAGNOSIS — E119 Type 2 diabetes mellitus without complications: Secondary | ICD-10-CM | POA: Insufficient documentation

## 2021-11-17 LAB — COMPREHENSIVE METABOLIC PANEL
ALT: 15 U/L (ref 0–44)
AST: 22 U/L (ref 15–41)
Albumin: 4.1 g/dL (ref 3.5–5.0)
Alkaline Phosphatase: 57 U/L (ref 38–126)
Anion gap: 5 (ref 5–15)
BUN: 14 mg/dL (ref 8–23)
CO2: 23 mmol/L (ref 22–32)
Calcium: 10.9 mg/dL — ABNORMAL HIGH (ref 8.9–10.3)
Chloride: 110 mmol/L (ref 98–111)
Creatinine, Ser: 1.08 mg/dL (ref 0.61–1.24)
GFR, Estimated: >60 mL/min (ref >60)
Glucose, Bld: 91 mg/dL (ref 70–99)
Potassium: 3.8 mmol/L (ref 3.5–5.1)
Sodium: 138 mmol/L (ref 135–145)
Total Bilirubin: 0.8 mg/dL (ref 0.3–1.2)
Total Protein: 7.7 g/dL (ref 6.5–8.1)

## 2021-11-17 LAB — URINALYSIS, ROUTINE W REFLEX MICROSCOPIC
Bilirubin Urine: NEGATIVE
Glucose, UA: NEGATIVE mg/dL
Hgb urine dipstick: NEGATIVE
Ketones, ur: NEGATIVE mg/dL
Leukocytes,Ua: NEGATIVE
Nitrite: NEGATIVE
Protein, ur: NEGATIVE mg/dL
Specific Gravity, Urine: 1.004 — ABNORMAL LOW (ref 1.005–1.030)
pH: 6 (ref 5.0–8.0)

## 2021-11-17 LAB — CBC WITH DIFFERENTIAL/PLATELET
Abs Immature Granulocytes: 0.03 10*3/uL (ref 0.00–0.07)
Basophils Absolute: 0 10*3/uL (ref 0.0–0.1)
Basophils Relative: 0 %
Eosinophils Absolute: 0.2 10*3/uL (ref 0.0–0.5)
Eosinophils Relative: 2 %
HCT: 46 % (ref 39.0–52.0)
Hemoglobin: 15.5 g/dL (ref 13.0–17.0)
Immature Granulocytes: 0 %
Lymphocytes Relative: 32 %
Lymphs Abs: 3.1 10*3/uL (ref 0.7–4.0)
MCH: 31.6 pg (ref 26.0–34.0)
MCHC: 33.7 g/dL (ref 30.0–36.0)
MCV: 93.9 fL (ref 80.0–100.0)
Monocytes Absolute: 0.5 10*3/uL (ref 0.1–1.0)
Monocytes Relative: 6 %
Neutro Abs: 5.8 10*3/uL (ref 1.7–7.7)
Neutrophils Relative %: 60 %
Platelets: 180 10*3/uL (ref 150–400)
RBC: 4.9 MIL/uL (ref 4.22–5.81)
RDW: 13.2 % (ref 11.5–15.5)
WBC: 9.7 10*3/uL (ref 4.0–10.5)
nRBC: 0 % (ref 0.0–0.2)

## 2021-11-17 LAB — TROPONIN I (HIGH SENSITIVITY)
Troponin I (High Sensitivity): 4 ng/L (ref <18)
Troponin I (High Sensitivity): 3 ng/L (ref <18)

## 2021-11-17 NOTE — ED Provider Notes (Signed)
[facility] Provider Note    Event Date/Time   First MD Initiated Contact with Patient 11/17/21 1130     (approximate)   History   Weakness   HPI  Glenn Torres is a 71 y.o. male presenting to the emergency department for treatment and evaluation of tremors.  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 He has not missed any doses. Tremors are in all extremities but worse in the left upper extremity.  He states that his lower extremities feel weak and like "they are concrete." He denies pain or shortness of breath. He had a sharp shooting pain in the back of his head earlier, but not now. This has happened a couple of times over the past 3 days. He is awaiting follow up appointment scheduled for next Wednesday with endocrinology at the [facility] after his labs showed a high calcium at his last PCP visit.  Past Medical History:  Diagnosis Date   Depression    Diabetes mellitus without complication (HCC)    GERD (gastroesophageal reflux disease)    Seizures (HCC)      Physical Exam   Triage Vital Signs: ED Triage Vitals  Enc Vitals Group     BP -- 132/71     Pulse -- 56     Resp -- 16     Temp -- 97.6     Temp src --      SpO2 -- 99     Weight 11/17/21 1130 176 lb (79.8 kg)     Height 11/17/21 1130 5\' 8"  (1.727 m)     Head Circumference --      Peak Flow --      Pain Score 11/17/21 1129 0     Pain Loc --      Pain Edu? --      Excl. in GC? --     Most recent vital signs: Vitals:   11/17/21 1400 11/17/21 1456  BP: 125/76 122/81  Pulse: (!) 52 60  Resp:  20  Temp:    SpO2: 95% 98%    General: Awake, no distress.  CV:  Good peripheral perfusion.  Resp:  Normal effort.  Abd:  No distention.  Other:  Neuro exam unremarkable.   ED Results / Procedures / Treatments   Labs (all labs ordered are listed, but only abnormal results are displayed) Labs Reviewed  COMPREHENSIVE METABOLIC PANEL - Abnormal; Notable for the following  components:      Result Value   Calcium 10.9 (*)    All other components within normal limits  URINALYSIS, ROUTINE W REFLEX MICROSCOPIC - Abnormal; Notable for the following components:   Color, Urine STRAW (*)    APPearance CLEAR (*)    Specific Gravity, Urine 1.004 (*)    All other components within normal limits  CBC WITH DIFFERENTIAL/PLATELET  TROPONIN I (HIGH SENSITIVITY)  TROPONIN I (HIGH SENSITIVITY)     EKG  Sinus rhythm with a rate of 56.  No ST changes.  Unchanged from September 2022.   RADIOLOGY  Chest x-ray negative for acute cardiopulmonary abnormality.  I have independently reviewed and interpreted imaging as well as reviewed report from radiology.  PROCEDURES:  Critical Care performed: No  Procedures   MEDICATIONS ORDERED IN ED:  Medications - No data to display   IMPRESSION / MDM / ASSESSMENT AND PLAN / ED COURSE   I reviewed the triage vital signs and the nursing notes.  Differential diagnosis includes,  but is not limited to: Tremors, TIA, CVA, acute cardiac event, seizure, electrolyte disturbance.  Patient's presentation is most consistent with acute presentation with potential threat to life or bodily function.  The patient is on the cardiac monitor to evaluate for evidence of arrhythmia and/or significant heart rate changes.   Normal Levetiracetam level on 11/01/21.   Labs are overall reassuring.  Calcium levels 10.9 which is his baseline.  Urinalysis is negative.  Serial troponins are negative.  Head CT is negative for acute concerns as is his chest x-ray.  His EKG is unchanged from previous.  Patient now able to ambulate without assistance with a steady gait.  Patient feels reassured by the negative results.  Admission for further work-up of weakness considered, however he is back to baseline and would prefer to go home.  He and his family feel comfortable with discharge.  He was encouraged to keep his follow-up appointment next week as  scheduled.  If he has any additional symptoms of concern and is unable to see his primary care provider, he is to return to the emergency department.  FINAL CLINICAL IMPRESSION(S) / ED DIAGNOSES   Final diagnoses:  Generalized weakness     Rx / DC Orders   ED Discharge Orders     None        Note:  This document was prepared using Dragon voice recognition software and may include unintentional dictation errors.   Chinita Pester, FNP 11/17/21 1901    Minna Antis, MD 11/26/21 [phone]

## 2021-12-20 ENCOUNTER — Other Ambulatory Visit: Payer: Self-pay

## 2021-12-20 ENCOUNTER — Emergency Department
Admission: EM | Admit: 2021-12-20 | Discharge: 2021-12-20 | Disposition: A | Discharge status: Home / Self Care | Payer: No Typology Code available for payment source | Attending: Emergency Medicine | Admitting: Emergency Medicine

## 2021-12-20 ENCOUNTER — Encounter: Payer: Self-pay | Admitting: Emergency Medicine

## 2021-12-20 DIAGNOSIS — R55 Syncope and collapse: Secondary | ICD-10-CM | POA: Diagnosis present

## 2021-12-20 DIAGNOSIS — E119 Type 2 diabetes mellitus without complications: Secondary | ICD-10-CM | POA: Insufficient documentation

## 2021-12-20 DIAGNOSIS — E860 Dehydration: Secondary | ICD-10-CM | POA: Insufficient documentation

## 2021-12-20 DIAGNOSIS — R569 Unspecified convulsions: Secondary | ICD-10-CM

## 2021-12-20 LAB — COMPREHENSIVE METABOLIC PANEL
ALT: 16 U/L (ref 0–44)
AST: 21 U/L (ref 15–41)
Albumin: 4 g/dL (ref 3.5–5.0)
Alkaline Phosphatase: 54 U/L (ref 38–126)
Anion gap: 6 (ref 5–15)
BUN: 17 mg/dL (ref 8–23)
CO2: 24 mmol/L (ref 22–32)
Calcium: 10.8 mg/dL — ABNORMAL HIGH (ref 8.9–10.3)
Chloride: 110 mmol/L (ref 98–111)
Creatinine, Ser: 1.48 mg/dL — ABNORMAL HIGH (ref 0.61–1.24)
GFR, Estimated: 51 mL/min — ABNORMAL LOW (ref >60)
Glucose, Bld: 94 mg/dL (ref 70–99)
Potassium: 4 mmol/L (ref 3.5–5.1)
Sodium: 140 mmol/L (ref 135–145)
Total Bilirubin: 1.1 mg/dL (ref 0.3–1.2)
Total Protein: 7.1 g/dL (ref 6.5–8.1)

## 2021-12-20 LAB — CBC WITH DIFFERENTIAL/PLATELET
Abs Immature Granulocytes: 0.04 10*3/uL (ref 0.00–0.07)
Basophils Absolute: 0 10*3/uL (ref 0.0–0.1)
Basophils Relative: 0 %
Eosinophils Absolute: 0.3 10*3/uL (ref 0.0–0.5)
Eosinophils Relative: 3 %
HCT: 42.5 % (ref 39.0–52.0)
Hemoglobin: 14.3 g/dL (ref 13.0–17.0)
Immature Granulocytes: 0 %
Lymphocytes Relative: 32 %
Lymphs Abs: 3.2 10*3/uL (ref 0.7–4.0)
MCH: 32.1 pg (ref 26.0–34.0)
MCHC: 33.6 g/dL (ref 30.0–36.0)
MCV: 95.5 fL (ref 80.0–100.0)
Monocytes Absolute: 0.7 10*3/uL (ref 0.1–1.0)
Monocytes Relative: 7 %
Neutro Abs: 5.8 10*3/uL (ref 1.7–7.7)
Neutrophils Relative %: 58 %
Platelets: 187 10*3/uL (ref 150–400)
RBC: 4.45 MIL/uL (ref 4.22–5.81)
RDW: 13.1 % (ref 11.5–15.5)
WBC: 10 10*3/uL (ref 4.0–10.5)
nRBC: 0 % (ref 0.0–0.2)

## 2021-12-20 MED ORDER — SODIUM CHLORIDE 0.9 % IV BOLUS
1000.0000 mL | Freq: Once | INTRAVENOUS | Status: AC
  Administered 2021-12-20: 1000 mL via INTRAVENOUS

## 2021-12-20 MED ORDER — LEVETIRACETAM 500 MG PO TABS
500.0000 mg | ORAL_TABLET | Freq: Once | ORAL | Status: AC
  Administered 2021-12-20: 500 mg via ORAL
  Filled 2021-12-20: qty 1

## 2021-12-20 NOTE — ED Notes (Signed)
Dr. Scotty Court at bedside assessing patient.  Patient stood at side of bed and stated "I am feeling dizzy" and started to lean forward and then fell forward onto ground.  No LOC.  Patient laying on ground stating "I am feeling dizzy"  Patient assisted to sitting position and then assisted to standing and onto stretcher.  Bilateral leg weakness noted.  Patient unable to take a step independently.  Placed in bed.  SR up x 2.  Bed in low position.  High fall risk bracelet placed on patient.  Patients shoes remain on.  Urinal given to patient with instruction - understanding verbalized.

## 2021-12-20 NOTE — ED Notes (Signed)
Patient ambualted in hallway with RN and NT stand by. Patient has steady gait. States he fells much better and is ready to go home. EDP made aware.

## 2021-12-20 NOTE — Discharge Instructions (Addendum)
Please seek medical attention for any high fevers, chest pain, shortness of breath, change in behavior, persistent vomiting, bloody stool or any other new or concerning symptoms.  

## 2021-12-20 NOTE — ED Provider Notes (Signed)
[facility] Provider Note    Event Date/Time   First MD Initiated Contact with Patient 12/20/21 1209     (approximate)   History   Chief Complaint: Seizures   HPI  Glenn Torres is a 71 y.o. male with a history of GERD, diabetes, seizure disorder, TBI who comes ED complaining of generalized weakness.  He reports he was standing up at home, when his whole body started to shake.  He did not lose consciousness.  His neighbor was there and helped lower him safely to the ground.  Vomiting, no head trauma, no pain.  No confusion afterward or urinary incontinence.  Reports compliance with his medications.  Patient does note that prior to this he had been working outside in his garden for many hours, and acknowledges that it was very hot out there and he may have been outside for too long.     Physical Exam   Triage Vital Signs: ED Triage Vitals  Enc Vitals Group     BP 12/20/21 1223 105/72     Pulse Rate 12/20/21 1223 71     Resp 12/20/21 1223 16     Temp 12/20/21 1223 98 F (36.7 C)     Temp Source 12/20/21 1223 Oral     SpO2 12/20/21 1212 96 %     Weight 12/20/21 1227 176 lb (79.8 kg)     Height 12/20/21 1227 5\' 8"  (1.727 m)     Head Circumference --      Peak Flow --      Pain Score 12/20/21 1227 0     Pain Loc --      Pain Edu? --      Excl. in GC? --     Most recent vital signs: Vitals:   12/20/21 1223 12/20/21 1254  BP: 105/72 106/69  Pulse: 71 68  Resp: 16 16  Temp: 98 F (36.7 C)   SpO2: 93% 97%    General: Awake, no distress.  CV:  Good peripheral perfusion.  Regular rate and rhythm Resp:  Normal effort.  Clear to auscultation bilaterally Abd:  No distention.  Soft and nontender Other:  Dry mucous membranes.  No lower extremity edema.  With attempting to stand , patient becomes very weak and unable to hold himself upright.  Had to be helped back into the stretcher.   ED Results / Procedures / Treatments   Labs (all labs  ordered are listed, but only abnormal results are displayed) Labs Reviewed  COMPREHENSIVE METABOLIC PANEL - Abnormal; Notable for the following components:      Result Value   Creatinine, Ser 1.48 (*)    Calcium 10.8 (*)    GFR, Estimated 51 (*)    All other components within normal limits  CBC WITH DIFFERENTIAL/PLATELET     EKG Interpreted by me Sinus bradycardia rate of 53.  Normal axis, normal intervals.  Right bundle branch block.  No acute ischemic changes.   RADIOLOGY    PROCEDURES:  Procedures   MEDICATIONS ORDERED IN ED: Medications  sodium chloride 0.9 % bolus 1,000 mL (0 mLs Intravenous Stopped 12/20/21 1331)  levETIRAcetam (KEPPRA) tablet 500 mg (500 mg Oral Given 12/20/21 1435)  sodium chloride 0.9 % bolus 1,000 mL (1,000 mLs Intravenous New Bag/Given 12/20/21 1435)     IMPRESSION / MDM / ASSESSMENT AND PLAN / ED COURSE  I reviewed the triage vital signs and the nursing notes.  Differential diagnosis includes, but is not limited to, dehydration, AKI, electrolyte abnormality, anemia  Patient's presentation is most consistent with acute presentation with potential threat to life or bodily function.  Patient presents with a reported episode of shaking.  On exam this is found to be orthostatic dizziness, likely due to dehydration.  We will give IV fluids and reassess.   Clinical Course as of 12/20/21 1455  Wed Dec 20, 2021  1440 Lab panel unremarkable.  Vital signs remain normal.  Patient is feeling better after fluids.  Family member showed me a video taken when first responders were assessing the patient, and it seems clear that he had orthostatic syncope related to prolonged heat exposure and dehydration.  I doubt seizure.  We will give additional fluids, attempt ambulation trial.  If he is still symptomatic he may need admission.  If he is feeling back to normal, he can be discharged outpatient follow-up.  He was given his 2 PM dose  of Keppra in the emergency department. [PS]    Clinical Course User Index [PS] Sharman Cheek, MD     FINAL CLINICAL IMPRESSION(S) / ED DIAGNOSES   Final diagnoses:  Dehydration  Seizure-like activity (HCC)     Rx / DC Orders   ED Discharge Orders     None        Note:  This document was prepared using Dragon voice recognition software and may include unintentional dictation errors.   Sharman Cheek, MD 12/20/21 1455

## 2021-12-20 NOTE — ED Triage Notes (Signed)
Per [facility] pt coming from home, patient was laying on the floor c/o tremors and left sided weakness. Patient states he has a seizure and remembers the incident. No neuro deficits noted.

## 2022-05-31 ENCOUNTER — Other Ambulatory Visit: Payer: Self-pay

## 2022-05-31 ENCOUNTER — Emergency Department: Payer: No Typology Code available for payment source

## 2022-05-31 ENCOUNTER — Emergency Department
Admission: EM | Admit: 2022-05-31 | Discharge: 2022-05-31 | Disposition: A | Discharge status: Home / Self Care | Payer: No Typology Code available for payment source | Attending: Emergency Medicine | Admitting: Emergency Medicine

## 2022-05-31 DIAGNOSIS — E119 Type 2 diabetes mellitus without complications: Secondary | ICD-10-CM | POA: Insufficient documentation

## 2022-05-31 DIAGNOSIS — U071 COVID-19: Secondary | ICD-10-CM | POA: Diagnosis not present

## 2022-05-31 DIAGNOSIS — J069 Acute upper respiratory infection, unspecified: Secondary | ICD-10-CM | POA: Insufficient documentation

## 2022-05-31 DIAGNOSIS — R509 Fever, unspecified: Secondary | ICD-10-CM | POA: Diagnosis present

## 2022-05-31 LAB — BASIC METABOLIC PANEL
Anion gap: 8 (ref 5–15)
BUN: 16 mg/dL (ref 8–23)
CO2: 23 mmol/L (ref 22–32)
Calcium: 10.5 mg/dL — ABNORMAL HIGH (ref 8.9–10.3)
Chloride: 104 mmol/L (ref 98–111)
Creatinine, Ser: 1.4 mg/dL — ABNORMAL HIGH (ref 0.61–1.24)
GFR, Estimated: 54 mL/min — ABNORMAL LOW (ref >60)
Glucose, Bld: 119 mg/dL — ABNORMAL HIGH (ref 70–99)
Potassium: 4.1 mmol/L (ref 3.5–5.1)
Sodium: 135 mmol/L (ref 135–145)

## 2022-05-31 LAB — RESP PANEL BY RT-PCR (RSV, FLU A&B, COVID)  RVPGX2
Influenza A by PCR: NEGATIVE
Influenza B by PCR: NEGATIVE
Resp Syncytial Virus by PCR: NEGATIVE
SARS Coronavirus 2 by RT PCR: POSITIVE — AB

## 2022-05-31 LAB — CBC WITH DIFFERENTIAL/PLATELET
Abs Immature Granulocytes: 0.02 10*3/uL (ref 0.00–0.07)
Basophils Absolute: 0 10*3/uL (ref 0.0–0.1)
Basophils Relative: 0 %
Eosinophils Absolute: 0.4 10*3/uL (ref 0.0–0.5)
Eosinophils Relative: 4 %
HCT: 44.4 % (ref 39.0–52.0)
Hemoglobin: 14.8 g/dL (ref 13.0–17.0)
Immature Granulocytes: 0 %
Lymphocytes Relative: 29 %
Lymphs Abs: 2.7 10*3/uL (ref 0.7–4.0)
MCH: 32.2 pg (ref 26.0–34.0)
MCHC: 33.3 g/dL (ref 30.0–36.0)
MCV: 96.5 fL (ref 80.0–100.0)
Monocytes Absolute: 0.8 10*3/uL (ref 0.1–1.0)
Monocytes Relative: 8 %
Neutro Abs: 5.6 10*3/uL (ref 1.7–7.7)
Neutrophils Relative %: 59 %
Platelets: 163 10*3/uL (ref 150–400)
RBC: 4.6 MIL/uL (ref 4.22–5.81)
RDW: 13 % (ref 11.5–15.5)
WBC: 9.5 10*3/uL (ref 4.0–10.5)
nRBC: 0 % (ref 0.0–0.2)

## 2022-05-31 MED ORDER — SODIUM CHLORIDE 0.9 % IV BOLUS
1000.0000 mL | Freq: Once | INTRAVENOUS | Status: AC
  Administered 2022-05-31: 1000 mL via INTRAVENOUS

## 2022-05-31 MED ORDER — ONDANSETRON HCL 4 MG/2ML IJ SOLN
4.0000 mg | Freq: Once | INTRAMUSCULAR | Status: AC
  Administered 2022-05-31: 4 mg via INTRAVENOUS
  Filled 2022-05-31: qty 2

## 2022-05-31 MED ORDER — KETOROLAC TROMETHAMINE 30 MG/ML IJ SOLN
15.0000 mg | Freq: Once | INTRAMUSCULAR | Status: AC
  Administered 2022-05-31: 15 mg via INTRAVENOUS
  Filled 2022-05-31: qty 1

## 2022-05-31 NOTE — Discharge Instructions (Signed)
As we discussed please drink plenty of fluids obtain plenty of rest.  Use Tylenol every 6 hours as needed for fever/discomfort.  As be recommended please obtain a home pulse oximeter.  If your oxygen level is low (90 or less) please return to the emergency department for further evaluation.  Also return if you feel short of breath develop chest pain or any other symptom personally concerning to yourself.

## 2022-05-31 NOTE — ED Provider Notes (Signed)
[facility] Provider Note    Event Date/Time   First MD Initiated Contact with Patient 05/31/22 1622     (approximate)  History   Chief Complaint: Fever  HPI  Glenn Torres is a 72 y.o. male with a past medical history of diabetes, gastric reflux, seizure disorder on Keppra presents to the emergency department for 2 days of generalized weakness subjective fever chills cough.  According to the patient for the past 2 days he has had the symptoms, wife has had similar symptoms for several days before him.  Denies any abdominal pain nausea vomiting or diarrhea.  No chest pain.  Does state congestion.  Physical Exam   Triage Vital Signs: ED Triage Vitals  Enc Vitals Group     BP 05/31/22 1608 137/69     Pulse Rate 05/31/22 1608 88     Resp 05/31/22 1608 16     Temp 05/31/22 1608 98.2 F (36.8 C)     Temp Source 05/31/22 1608 Oral     SpO2 05/31/22 1608 97 %     Weight 05/31/22 1607 175 lb 14.8 oz (79.8 kg)     Height 05/31/22 1607 5\' 8"  (1.727 m)     Head Circumference --      Peak Flow --      Pain Score 05/31/22 1607 0     Pain Loc --      Pain Edu? --      Excl. in [place]? --     Most recent vital signs: Vitals:   05/31/22 1608  BP: 137/69  Pulse: 88  Resp: 16  Temp: 98.2 F (36.8 C)  SpO2: 97%    General: Awake, no distress.  CV:  Good peripheral perfusion.  Regular rate and rhythm  Resp:  Normal effort.  Equal breath sounds bilaterally.  No wheeze rales or rhonchi Abd:  No distention.  Soft, nontender.  No rebound or guarding.  ED Results / Procedures / Treatments   RADIOLOGY  Chest x-ray viewed and interpreted by myself no consolidation seen on my evaluation. Chest x-ray read as negative by radiology.  MEDICATIONS ORDERED IN ED: Medications  ketorolac (TORADOL) 30 MG/ML injection 15 mg (has no administration in time range)  sodium chloride 0.9 % bolus 1,000 mL (1,000 mLs Intravenous New Bag/Given 05/31/22 1649)  ondansetron  (ZOFRAN) injection 4 mg (4 mg Intravenous Given 05/31/22 1650)     IMPRESSION / MDM / ASSESSMENT AND PLAN / ED COURSE  I reviewed the triage vital signs and the nursing notes.  Patient's presentation is most consistent with acute presentation with potential threat to life or bodily function.  Patient presents emergency department for 2 days of subjective fever chills weakness cough congestion.  States wife has similar symptoms at home as well.  Overall the patient appears well, reassuring vital signs, reassuring physical exam with clear lung sounds.  Chest x-ray I do not appreciate any consolidation waiting on radiology read.  CBC is normal, chemistry is normal.  COVID/flu/RSV is pending.  We will IV hydrate, treat with Toradol and Zofran and continue to closely monitor.  Patient agreeable to plan of care.  X-ray read as negative by radiology.  Chemistry shows no concerning findings, CBC is normal.  Patient is COVID-positive on his swab likely the cause of the patient's symptoms.  In speaking with the patient he states his symptoms really started probably 4 days ago.  As the patient has outside of the 48-hour window the Paxlovid has  been beneficial do not believe Paxlovid would be of much benefit to the patient.  I discussed supportive care Tylenol every 6 hours if needed for fever/discomfort, fluids and rest.  I also discussed obtaining a pulse oximeter so he can keep tabs of his oxygen level at home.  Provided my typical COVID-19 return precautions.  FINAL CLINICAL IMPRESSION(S) / ED DIAGNOSES   Upper respiratory infection COVID-19   Note:  This document was prepared using Dragon voice recognition software and may include unintentional dictation errors.   Harvest Dark, MD 05/31/22 [phone]

## 2022-05-31 NOTE — ED Triage Notes (Signed)
2 day history of fever and chills.

## 2022-08-10 ENCOUNTER — Other Ambulatory Visit: Payer: Self-pay | Admitting: Otolaryngology

## 2022-09-05 ENCOUNTER — Encounter (HOSPITAL_COMMUNITY): Payer: Self-pay

## 2022-09-05 ENCOUNTER — Encounter (HOSPITAL_COMMUNITY)
Admission: RE | Admit: 2022-09-05 | Discharge: 2022-09-05 | Disposition: A | Discharge status: Home / Self Care | Payer: No Typology Code available for payment source | Source: Ambulatory Visit | Attending: Otolaryngology | Admitting: Otolaryngology

## 2022-09-05 ENCOUNTER — Other Ambulatory Visit: Payer: Self-pay

## 2022-09-05 VITALS — BP 129/76 | HR 74 | Temp 97.8°F | Resp 18 | Ht 68.0 in | Wt 183.2 lb

## 2022-09-05 DIAGNOSIS — Z01818 Encounter for other preprocedural examination: Secondary | ICD-10-CM | POA: Diagnosis not present

## 2022-09-05 DIAGNOSIS — R569 Unspecified convulsions: Secondary | ICD-10-CM | POA: Diagnosis not present

## 2022-09-05 HISTORY — DX: Cluster headache syndrome, unspecified, not intractable: G44.009

## 2022-09-05 HISTORY — DX: Benign neoplasm of parathyroid gland: D35.1

## 2022-09-05 LAB — CBC
HCT: 41.8 % (ref 39.0–52.0)
Hemoglobin: 14.1 g/dL (ref 13.0–17.0)
MCH: 32.2 pg (ref 26.0–34.0)
MCHC: 33.7 g/dL (ref 30.0–36.0)
MCV: 95.4 fL (ref 80.0–100.0)
Platelets: 171 10*3/uL (ref 150–400)
RBC: 4.38 MIL/uL (ref 4.22–5.81)
RDW: 13.2 % (ref 11.5–15.5)
WBC: 8.6 10*3/uL (ref 4.0–10.5)
nRBC: 0 % (ref 0.0–0.2)

## 2022-09-05 LAB — BASIC METABOLIC PANEL
Anion gap: 10 (ref 5–15)
BUN: 15 mg/dL (ref 8–23)
CO2: 25 mmol/L (ref 22–32)
Calcium: 10.8 mg/dL — ABNORMAL HIGH (ref 8.9–10.3)
Chloride: 103 mmol/L (ref 98–111)
Creatinine, Ser: 1.15 mg/dL (ref 0.61–1.24)
GFR, Estimated: >60 mL/min (ref >60)
Glucose, Bld: 91 mg/dL (ref 70–99)
Potassium: 4 mmol/L (ref 3.5–5.1)
Sodium: 138 mmol/L (ref 135–145)

## 2022-09-05 NOTE — Progress Notes (Signed)
[facility] - [facility]  PPM/ICD - denies  Chest x-ray - 05/31/22 EKG - 12/20/21 Stress Test - denies ECHO - denies Cardiac Cath - denies  Sleep Study - denies   DM- denies  ASA/Blood Thinner Instructions: n/a   ERAS Protcol - yes, no drink   COVID TEST- n/a   Anesthesia review: no  Patient denies shortness of breath, fever, cough and chest pain at PAT appointment   All instructions explained to the patient, with a verbal understanding of the material. Patient agrees to go over the instructions while at home for a better understanding. The opportunity to ask questions was provided.

## 2022-09-05 NOTE — Progress Notes (Signed)
Pt had 2 charts with 2 different MRN numbers. Charts had to be merged during PAT appt. There were issues when putting PAT lab orders in. IT is working to resolve this.

## 2022-09-12 ENCOUNTER — Other Ambulatory Visit: Payer: Self-pay

## 2022-09-12 ENCOUNTER — Encounter (HOSPITAL_COMMUNITY)
Admission: RE | Disposition: A | Discharge status: Home / Self Care | Payer: Self-pay | Source: Ambulatory Visit | Attending: Otolaryngology

## 2022-09-12 ENCOUNTER — Ambulatory Visit (HOSPITAL_BASED_OUTPATIENT_CLINIC_OR_DEPARTMENT_OTHER): Payer: No Typology Code available for payment source | Admitting: Certified Registered Nurse Anesthetist

## 2022-09-12 ENCOUNTER — Encounter (HOSPITAL_COMMUNITY): Payer: Self-pay | Admitting: Otolaryngology

## 2022-09-12 ENCOUNTER — Ambulatory Visit (HOSPITAL_COMMUNITY): Payer: No Typology Code available for payment source | Admitting: Certified Registered Nurse Anesthetist

## 2022-09-12 ENCOUNTER — Observation Stay (HOSPITAL_COMMUNITY)
Admission: RE | Admit: 2022-09-12 | Discharge: 2022-09-13 | Disposition: A | Discharge status: Home / Self Care | Payer: No Typology Code available for payment source | Source: Ambulatory Visit | Attending: Otolaryngology | Admitting: Otolaryngology

## 2022-09-12 DIAGNOSIS — F17210 Nicotine dependence, cigarettes, uncomplicated: Secondary | ICD-10-CM | POA: Diagnosis not present

## 2022-09-12 DIAGNOSIS — E213 Hyperparathyroidism, unspecified: Secondary | ICD-10-CM | POA: Diagnosis present

## 2022-09-12 DIAGNOSIS — D34 Benign neoplasm of thyroid gland: Secondary | ICD-10-CM | POA: Diagnosis not present

## 2022-09-12 DIAGNOSIS — E210 Primary hyperparathyroidism: Secondary | ICD-10-CM

## 2022-09-12 DIAGNOSIS — D351 Benign neoplasm of parathyroid gland: Secondary | ICD-10-CM | POA: Diagnosis not present

## 2022-09-12 DIAGNOSIS — Z79899 Other long term (current) drug therapy: Secondary | ICD-10-CM | POA: Diagnosis not present

## 2022-09-12 DIAGNOSIS — Z7982 Long term (current) use of aspirin: Secondary | ICD-10-CM | POA: Insufficient documentation

## 2022-09-12 HISTORY — PX: PARATHYROIDECTOMY: SHX19

## 2022-09-12 SURGERY — PARATHYROIDECTOMY
Anesthesia: General | Site: Neck | Laterality: Right

## 2022-09-12 MED ORDER — LIDOCAINE-EPINEPHRINE 1 %-1:100000 IJ SOLN
INTRAMUSCULAR | Status: DC | PRN
  Administered 2022-09-12: 1 mL

## 2022-09-12 MED ORDER — MIDAZOLAM HCL 2 MG/2ML IJ SOLN
INTRAMUSCULAR | Status: DC | PRN
  Administered 2022-09-12: 2 mg via INTRAVENOUS

## 2022-09-12 MED ORDER — LIDOCAINE 2% (20 MG/ML) 5 ML SYRINGE
INTRAMUSCULAR | Status: AC
  Filled 2022-09-12: qty 10

## 2022-09-12 MED ORDER — FENTANYL CITRATE (PF) 100 MCG/2ML IJ SOLN
INTRAMUSCULAR | Status: AC
  Filled 2022-09-12: qty 2

## 2022-09-12 MED ORDER — SUGAMMADEX SODIUM 200 MG/2ML IV SOLN
INTRAVENOUS | Status: DC | PRN
  Administered 2022-09-12: 200 mg via INTRAVENOUS

## 2022-09-12 MED ORDER — ONDANSETRON HCL 4 MG/2ML IJ SOLN: 4.0000 mg | Freq: Once | INTRAMUSCULAR | Status: DC | PRN

## 2022-09-12 MED ORDER — CHLORHEXIDINE GLUCONATE 0.12 % MT SOLN
15.0000 mL | Freq: Once | OROMUCOSAL | Status: AC
  Administered 2022-09-12: 15 mL via OROMUCOSAL
  Filled 2022-09-12: qty 15

## 2022-09-12 MED ORDER — MOMETASONE FURO-FORMOTEROL FUM 100-5 MCG/ACT IN AERO
2.0000 | INHALATION_SPRAY | Freq: Two times a day (BID) | RESPIRATORY_TRACT | Status: DC
  Administered 2022-09-12: 2 via RESPIRATORY_TRACT
  Filled 2022-09-12: qty 8.8

## 2022-09-12 MED ORDER — DEXAMETHASONE SODIUM PHOSPHATE 10 MG/ML IJ SOLN
INTRAMUSCULAR | Status: AC
  Filled 2022-09-12: qty 4

## 2022-09-12 MED ORDER — MIDAZOLAM HCL 2 MG/2ML IJ SOLN
INTRAMUSCULAR | Status: AC
  Filled 2022-09-12: qty 2

## 2022-09-12 MED ORDER — HYDROCODONE-ACETAMINOPHEN 5-325 MG PO TABS
1.0000 | ORAL_TABLET | Freq: Four times a day (QID) | ORAL | Status: DC | PRN
  Administered 2022-09-12 – 2022-09-13 (×2): 1 via ORAL
  Filled 2022-09-12 (×2): qty 1

## 2022-09-12 MED ORDER — ACETAMINOPHEN 500 MG PO TABS: 500.0000 mg | ORAL_TABLET | Freq: Two times a day (BID) | ORAL | Status: DC | PRN

## 2022-09-12 MED ORDER — ADULT MULTIVITAMIN W/MINERALS CH
1.0000 | ORAL_TABLET | Freq: Every day | ORAL | Status: DC
  Administered 2022-09-12: 1 via ORAL
  Filled 2022-09-12: qty 1

## 2022-09-12 MED ORDER — TRAZODONE HCL 50 MG PO TABS: 100.0000 mg | ORAL_TABLET | Freq: Every day | ORAL | Status: DC

## 2022-09-12 MED ORDER — LACTATED RINGERS IV SOLN: INTRAVENOUS | Status: DC

## 2022-09-12 MED ORDER — DEXMEDETOMIDINE HCL IN NACL 80 MCG/20ML IV SOLN
INTRAVENOUS | Status: AC
  Filled 2022-09-12: qty 20

## 2022-09-12 MED ORDER — ACETAMINOPHEN 500 MG PO TABS: 1000.0000 mg | ORAL_TABLET | Freq: Every day | ORAL | Status: DC | PRN

## 2022-09-12 MED ORDER — HEMOSTATIC AGENTS (NO CHARGE) OPTIME
TOPICAL | Status: DC | PRN
  Administered 2022-09-12: 1 via TOPICAL

## 2022-09-12 MED ORDER — TAMSULOSIN HCL 0.4 MG PO CAPS: 0.4000 mg | ORAL_CAPSULE | Freq: Every day | ORAL | Status: DC

## 2022-09-12 MED ORDER — ROCURONIUM BROMIDE 10 MG/ML (PF) SYRINGE
PREFILLED_SYRINGE | INTRAVENOUS | Status: AC
  Filled 2022-09-12: qty 10

## 2022-09-12 MED ORDER — FINASTERIDE 5 MG PO TABS
5.0000 mg | ORAL_TABLET | Freq: Every day | ORAL | Status: DC
  Administered 2022-09-13: 5 mg via ORAL
  Filled 2022-09-12: qty 1

## 2022-09-12 MED ORDER — IBUPROFEN 200 MG PO TABS: 800.0000 mg | ORAL_TABLET | Freq: Three times a day (TID) | ORAL | Status: DC

## 2022-09-12 MED ORDER — PROPOFOL 10 MG/ML IV BOLUS
INTRAVENOUS | Status: AC
  Filled 2022-09-12: qty 20

## 2022-09-12 MED ORDER — MULTI-VITAMIN/MINERALS PO TABS: 1.0000 | ORAL_TABLET | Freq: Every day | ORAL | Status: DC

## 2022-09-12 MED ORDER — ROCURONIUM BROMIDE 10 MG/ML (PF) SYRINGE
PREFILLED_SYRINGE | INTRAVENOUS | Status: DC | PRN
  Administered 2022-09-12: 20 mg via INTRAVENOUS
  Administered 2022-09-12: 50 mg via INTRAVENOUS

## 2022-09-12 MED ORDER — FENTANYL CITRATE (PF) 250 MCG/5ML IJ SOLN
INTRAMUSCULAR | Status: AC
  Filled 2022-09-12: qty 5

## 2022-09-12 MED ORDER — ORAL CARE MOUTH RINSE: 15.0000 mL | Freq: Once | OROMUCOSAL | Status: AC

## 2022-09-12 MED ORDER — SUMATRIPTAN SUCCINATE 100 MG PO TABS: 100.0000 mg | ORAL_TABLET | ORAL | Status: DC | PRN

## 2022-09-12 MED ORDER — FENTANYL CITRATE (PF) 100 MCG/2ML IJ SOLN
25.0000 ug | INTRAMUSCULAR | Status: DC | PRN
  Administered 2022-09-12 (×2): 50 ug via INTRAVENOUS

## 2022-09-12 MED ORDER — ONDANSETRON HCL 4 MG/2ML IJ SOLN
INTRAMUSCULAR | Status: DC | PRN
  Administered 2022-09-12: 4 mg via INTRAVENOUS

## 2022-09-12 MED ORDER — ATROPINE SULFATE 0.4 MG/ML IV SOLN
INTRAVENOUS | Status: AC
  Filled 2022-09-12: qty 1

## 2022-09-12 MED ORDER — IBUPROFEN 200 MG PO TABS: 400.0000 mg | ORAL_TABLET | Freq: Four times a day (QID) | ORAL | Status: DC | PRN

## 2022-09-12 MED ORDER — ALBUTEROL SULFATE HFA 108 (90 BASE) MCG/ACT IN AERS: 1.0000 | INHALATION_SPRAY | Freq: Every day | RESPIRATORY_TRACT | Status: DC | PRN

## 2022-09-12 MED ORDER — DEXAMETHASONE SODIUM PHOSPHATE 10 MG/ML IJ SOLN
INTRAMUSCULAR | Status: DC | PRN
  Administered 2022-09-12: 10 mg via INTRAVENOUS

## 2022-09-12 MED ORDER — FENTANYL CITRATE (PF) 250 MCG/5ML IJ SOLN
INTRAMUSCULAR | Status: DC | PRN
  Administered 2022-09-12: 100 ug via INTRAVENOUS
  Administered 2022-09-12: 50 ug via INTRAVENOUS

## 2022-09-12 MED ORDER — ASPIRIN 81 MG PO TBEC: 81.0000 mg | DELAYED_RELEASE_TABLET | Freq: Every day | ORAL | Status: DC

## 2022-09-12 MED ORDER — LIDOCAINE-EPINEPHRINE 1 %-1:100000 IJ SOLN
INTRAMUSCULAR | Status: AC
  Filled 2022-09-12: qty 1

## 2022-09-12 MED ORDER — ORAL CARE MOUTH RINSE: 15.0000 mL | Freq: Once | OROMUCOSAL | Status: DC

## 2022-09-12 MED ORDER — PRAZOSIN HCL 2 MG PO CAPS: 5.0000 mg | ORAL_CAPSULE | Freq: Every evening | ORAL | Status: DC | PRN

## 2022-09-12 MED ORDER — ESCITALOPRAM OXALATE 10 MG PO TABS
20.0000 mg | ORAL_TABLET | Freq: Every day | ORAL | Status: DC
  Filled 2022-09-12: qty 2

## 2022-09-12 MED ORDER — SUCCINYLCHOLINE CHLORIDE 200 MG/10ML IV SOSY
PREFILLED_SYRINGE | INTRAVENOUS | Status: AC
  Filled 2022-09-12: qty 10

## 2022-09-12 MED ORDER — TRAZODONE HCL 50 MG PO TABS: 100.0000 mg | ORAL_TABLET | Freq: Every evening | ORAL | Status: DC | PRN

## 2022-09-12 MED ORDER — LEVETIRACETAM 500 MG PO TABS
1000.0000 mg | ORAL_TABLET | Freq: Every day | ORAL | Status: DC
  Administered 2022-09-12: 1000 mg via ORAL
  Filled 2022-09-12 (×2): qty 2

## 2022-09-12 MED ORDER — CEFAZOLIN SODIUM-DEXTROSE 2-4 GM/100ML-% IV SOLN
2.0000 g | INTRAVENOUS | Status: AC
  Administered 2022-09-12: 2 g via INTRAVENOUS
  Filled 2022-09-12: qty 100

## 2022-09-12 MED ORDER — LIDOCAINE 2% (20 MG/ML) 5 ML SYRINGE
INTRAMUSCULAR | Status: DC | PRN
  Administered 2022-09-12: 80 mg via INTRAVENOUS

## 2022-09-12 MED ORDER — ALBUTEROL SULFATE (2.5 MG/3ML) 0.083% IN NEBU: 2.5000 mg | INHALATION_SOLUTION | Freq: Every day | RESPIRATORY_TRACT | Status: DC | PRN

## 2022-09-12 MED ORDER — CEPHALEXIN 500 MG PO CAPS: 500.0000 mg | ORAL_CAPSULE | Freq: Two times a day (BID) | ORAL | Status: DC

## 2022-09-12 MED ORDER — POLYVINYL ALCOHOL 1.4 % OP SOLN: 1.0000 [drp] | OPHTHALMIC | Status: DC | PRN

## 2022-09-12 MED ORDER — SERTRALINE HCL 100 MG PO TABS: 100.0000 mg | ORAL_TABLET | Freq: Every day | ORAL | Status: DC

## 2022-09-12 MED ORDER — PROPOFOL 10 MG/ML IV BOLUS
INTRAVENOUS | Status: DC | PRN
  Administered 2022-09-12: 130 mg via INTRAVENOUS

## 2022-09-12 MED ORDER — 0.9 % SODIUM CHLORIDE (POUR BTL) OPTIME
TOPICAL | Status: DC | PRN
  Administered 2022-09-12: 1000 mL

## 2022-09-12 MED ORDER — LEVETIRACETAM 250 MG PO TABS: 250.0000 mg | ORAL_TABLET | ORAL | Status: DC

## 2022-09-12 MED ORDER — SUMATRIPTAN 5 MG/ACT NA SOLN: 1.0000 | NASAL | Status: DC | PRN

## 2022-09-12 MED ORDER — BUPROPION HCL ER (SR) 150 MG PO TB12
150.0000 mg | ORAL_TABLET | Freq: Two times a day (BID) | ORAL | Status: DC
  Filled 2022-09-12 (×2): qty 1

## 2022-09-12 MED ORDER — CHLORHEXIDINE GLUCONATE 0.12 % MT SOLN: 15.0000 mL | Freq: Once | OROMUCOSAL | Status: DC

## 2022-09-12 MED ORDER — ACETAMINOPHEN 500 MG PO TABS
1000.0000 mg | ORAL_TABLET | Freq: Once | ORAL | Status: AC
  Administered 2022-09-12: 1000 mg via ORAL
  Filled 2022-09-12: qty 2

## 2022-09-12 MED ORDER — KCL IN DEXTROSE-NACL 20-5-0.45 MEQ/L-%-% IV SOLN
INTRAVENOUS | Status: DC
  Filled 2022-09-12: qty 1000

## 2022-09-12 MED ORDER — LEVETIRACETAM 750 MG PO TABS
750.0000 mg | ORAL_TABLET | ORAL | Status: DC
  Administered 2022-09-12 – 2022-09-13 (×2): 750 mg via ORAL
  Filled 2022-09-12 (×4): qty 1

## 2022-09-12 MED ORDER — ACETAMINOPHEN 325 MG PO TABS
650.0000 mg | ORAL_TABLET | Freq: Four times a day (QID) | ORAL | Status: DC | PRN
  Administered 2022-09-12: 650 mg via ORAL
  Filled 2022-09-12: qty 2

## 2022-09-12 MED ORDER — ONDANSETRON HCL 4 MG/2ML IJ SOLN
INTRAMUSCULAR | Status: AC
  Filled 2022-09-12: qty 8

## 2022-09-12 SURGICAL SUPPLY — 45 items
ADH SKN CLS APL DERMABOND .7 (GAUZE/BANDAGES/DRESSINGS) ×1
BAG COUNTER SPONGE SURGICOUNT (BAG) ×1 IMPLANT
BAG SPNG CNTER NS LX DISP (BAG) ×1
BLADE SURG 15 STRL LF DISP TIS (BLADE) IMPLANT
BLADE SURG 15 STRL SS (BLADE)
CANISTER SUCT 3000ML PPV (MISCELLANEOUS) ×1 IMPLANT
CLEANER TIP ELECTROSURG 2X2 (MISCELLANEOUS) ×1 IMPLANT
CNTNR URN SCR LID CUP LEK RST (MISCELLANEOUS) IMPLANT
CONT SPEC 4OZ STRL OR WHT (MISCELLANEOUS) ×1
CORD BIPOLAR FORCEPS 12FT (ELECTRODE) ×1 IMPLANT
COVER SURGICAL LIGHT HANDLE (MISCELLANEOUS) ×1 IMPLANT
DERMABOND ADVANCED .7 DNX12 (GAUZE/BANDAGES/DRESSINGS) ×1 IMPLANT
DRAIN JACKSON RD 7FR 3/32 (WOUND CARE) IMPLANT
DRAIN JP 10F RND RADIO (DRAIN) IMPLANT
DRAPE HALF SHEET 40X57 (DRAPES) IMPLANT
ELECT COATED BLADE 2.86 ST (ELECTRODE) ×1 IMPLANT
ELECT REM PT RETURN 9FT ADLT (ELECTROSURGICAL) ×1
ELECTRODE REM PT RTRN 9FT ADLT (ELECTROSURGICAL) ×1 IMPLANT
EVACUATOR SILICONE 100CC (DRAIN) ×1 IMPLANT
FORCEPS BIPOLAR SPETZLER 8 1.0 (NEUROSURGERY SUPPLIES) ×1 IMPLANT
GAUZE 4X4 16PLY ~~LOC~~+RFID DBL (SPONGE) ×1 IMPLANT
GLOVE BIO SURGEON STRL SZ7.5 (GLOVE) ×1 IMPLANT
GOWN STRL REUS W/ TWL LRG LVL3 (GOWN DISPOSABLE) ×2 IMPLANT
GOWN STRL REUS W/TWL LRG LVL3 (GOWN DISPOSABLE) ×2
HEMOSTAT SURGICEL 2X14 (HEMOSTASIS) IMPLANT
KIT BASIN OR (CUSTOM PROCEDURE TRAY) ×1 IMPLANT
KIT TURNOVER KIT B (KITS) ×1 IMPLANT
LOCATOR NERVE 3 VOLT (DISPOSABLE) IMPLANT
NDL HYPO 25GX1X1/2 BEV (NEEDLE) ×1 IMPLANT
NEEDLE HYPO 25GX1X1/2 BEV (NEEDLE) ×1 IMPLANT
NS IRRIG 1000ML POUR BTL (IV SOLUTION) ×1 IMPLANT
PAD ARMBOARD 7.5X6 YLW CONV (MISCELLANEOUS) ×2 IMPLANT
PENCIL SMOKE EVACUATOR (MISCELLANEOUS) ×1 IMPLANT
POSITIONER HEAD DONUT 9IN (MISCELLANEOUS) IMPLANT
SHEARS HARMONIC 9CM CVD (BLADE) ×1 IMPLANT
SPONGE INTESTINAL PEANUT (DISPOSABLE) IMPLANT
STAPLER VISISTAT 35W (STAPLE) ×1 IMPLANT
SUT ETHILON 2 0 FS 18 (SUTURE) ×1 IMPLANT
SUT SILK 3 0 REEL (SUTURE) ×1 IMPLANT
SUT VIC AB 3-0 SH 27 (SUTURE) ×1
SUT VIC AB 3-0 SH 27X BRD (SUTURE) ×1 IMPLANT
SUT VICRYL 4-0 PS2 18IN ABS (SUTURE) ×1 IMPLANT
TOWEL GREEN STERILE FF (TOWEL DISPOSABLE) ×1 IMPLANT
TRAY ENT MC OR (CUSTOM PROCEDURE TRAY) ×1 IMPLANT
TUBE 10FR 43IN 5G WT TIP ENFIT (TUBING) IMPLANT

## 2022-09-12 NOTE — Anesthesia Procedure Notes (Signed)
Procedure Name: Intubation Date/Time: 09/12/2022 12:33 PM  Performed by: Lucinda Dell, CRNAPre-anesthesia Checklist: Patient identified, Emergency Drugs available, Suction available and Patient being monitored Patient Re-evaluated:Patient Re-evaluated prior to induction Oxygen Delivery Method: Circle system utilized Preoxygenation: Pre-oxygenation with 100% oxygen Induction Type: IV induction Ventilation: Mask ventilation without difficulty Laryngoscope Size: Glidescope and 3 Tube type: Oral Tube size: 6.0 mm Number of attempts: 1 Airway Equipment and Method: Stylet and Video-laryngoscopy Placement Confirmation: ETT inserted through vocal cords under direct vision, positive ETCO2 and breath sounds checked- equal and bilateral Secured at: 22 cm Tube secured with: Tape Dental Injury: Teeth and Oropharynx as per pre-operative assessment

## 2022-09-12 NOTE — Transfer of Care (Signed)
Immediate Anesthesia Transfer of Care Note  Patient: Glenn Torres  Procedure(s) Performed: PARATHYROIDECTOMY (Right: Neck)  Patient Location: PACU  Anesthesia Type:General  Level of Consciousness: awake, alert , and oriented  Airway & Oxygen Therapy: Patient Spontanous Breathing  Post-op Assessment: Report given to RN and Post -op Vital signs reviewed and stable  Post vital signs: Reviewed and stable  Last Vitals:  Vitals Value Taken Time  BP 136/81 09/12/22 1430  Temp 36.6 C 09/12/22 1430  Pulse 78 09/12/22 1431  Resp 15 09/12/22 1431  SpO2 95 % 09/12/22 1431  Vitals shown include unvalidated device data.  Last Pain:  Vitals:   09/12/22 1225  TempSrc:   PainSc: 2          Complications: No notable events documented.

## 2022-09-12 NOTE — Progress Notes (Signed)
Walked patient to the bathroom and he had a syncopal/seizure episode. Dr. Desmond Lope assisted patient to the floor. Pt was communicating with staff during the event.  Pt did not hit his head. With assistance from staff members, we helped him to a stretcher and placed him on the monitor to gather vitals.

## 2022-09-12 NOTE — Op Note (Signed)
Preop diagnosis: Hyperparathyroidism Postop diagnosis: same Procedure: Right superior parathyroidectomy Surgeon: Jenne Pane Assist: RNFA Anesth: General and local with 1% lidocaine with 1:100,000 epinephrine Compl: None Findings: Right superior parathyroid deep to the superior thyroid pedicle found to be enlarged. Description:  After discussing risks, benefits, and alternatives, the patient was brought to the operative suite and placed on the operative table in the supine position.  Anesthesia was induced and the patient was intubated by the anesthesia team without difficulty.  The eyes were taped closed and a shoulder roll was placed.  The right parathyroid incision was marked with a marking pen and injected with local anesthetic.  The neck was prepped and draped in sterile fashion.  The incision was made with a 15 blade scalpel and extended through the subcutaneous and platysma layers with Bovie electrocautery.  The midline raphe of the strap muscles was divided and strap muscles retracted to the right.  Dissection was then performed around the lobe using the harmonic scalpel including division of the superior pedicle and dissection around the lateral aspect.  The right superior parathyroid gland was found deep to the superior pedicle and was obviously enlarged.  It was dissected free from surrounding tissues and removed.  It was passed to nursing for pathology.  Bleeding was controlled with Bipolar electrocautery after a Valsalva was given.  After irrigating copiously, a strip of Surgicell was laid in the depth of the wound.  The strap muscles were closed with a single 3-0 Vicryl suture.  The platysma layer was closed with 3-0 Vicryl in a simple, interrupted fashion.  The subcutaneous layer was similarly closed with 4-0 Vicryl and the skin was closed with glue.  Drapes were removed and the patient was cleaned off.  He was returned to anesthesia for wake-up, was extubated, and moved to the recovery room in  stable condition.

## 2022-09-12 NOTE — Anesthesia Postprocedure Evaluation (Signed)
Anesthesia Post Note  Patient: Glenn Torres  Procedure(s) Performed: PARATHYROIDECTOMY (Right: Neck)     Patient location during evaluation: PACU Anesthesia Type: General Level of consciousness: awake and alert Pain management: pain level controlled Vital Signs Assessment: post-procedure vital signs reviewed and stable Respiratory status: spontaneous breathing, nonlabored ventilation, respiratory function stable and patient connected to nasal cannula oxygen Cardiovascular status: blood pressure returned to baseline and stable Postop Assessment: no apparent nausea or vomiting Anesthetic complications: no   No notable events documented.  Last Vitals:  Vitals:   09/12/22 1530 09/12/22 1640  BP: 108/60 (!) 113/91  Pulse: (!) 57 (!) 57  Resp: 10   Temp: 36.4 C 36.4 C  SpO2: 95% 99%    Last Pain:  Vitals:   09/12/22 1640  TempSrc: Oral  PainSc:                  [name]

## 2022-09-12 NOTE — Brief Op Note (Signed)
09/12/2022  2:15 PM  PATIENT:  Glenn Torres  72 y.o. male  PRE-OPERATIVE DIAGNOSIS:  BENIGN NEOPLASM OF PARATHYROID GLAND  POST-OPERATIVE DIAGNOSIS:  BENIGN NEOPLASM OF PARATHYROID GLAND  PROCEDURE:  Procedure(s): PARATHYROIDECTOMY (Right)  SURGEON:  Surgeon(s) and Role:    Christia Reading, MD - Primary  PHYSICIAN ASSISTANT:   ASSISTANTS: RNFA   ANESTHESIA:   general  EBL:  Minimal   BLOOD ADMINISTERED:none  DRAINS: none   LOCAL MEDICATIONS USED:  LIDOCAINE   SPECIMEN:  Source of Specimen:  Right superior parathyroid gland  DISPOSITION OF SPECIMEN:  PATHOLOGY  COUNTS:  YES  TOURNIQUET:  * No tourniquets in log *  DICTATION: .Note written in EPIC  PLAN OF CARE: Admit for overnight observation  PATIENT DISPOSITION:  PACU - hemodynamically stable.   Delay start of Pharmacological VTE agent (>24hrs) due to surgical blood loss or risk of bleeding: no

## 2022-09-12 NOTE — Anesthesia Preprocedure Evaluation (Signed)
Anesthesia Evaluation  Patient identified by MRN, date of birth, ID band Patient awake    Reviewed: Allergy & Precautions, NPO status , Patient's Chart, lab work & pertinent test results  Airway Mallampati: II  TM Distance: >3 FB Neck ROM: Full    Dental  (+) Teeth Intact, Dental Advisory Given   Pulmonary Current Smoker and Patient abstained from smoking.   Pulmonary exam normal breath sounds clear to auscultation       Cardiovascular negative cardio ROS Normal cardiovascular exam Rhythm:Regular Rate:Normal     Neuro/Psych  Headaches, Seizures -, Poorly Controlled,  PSYCHIATRIC DISORDERS  Depression       GI/Hepatic Neg liver ROS,GERD  ,,  Endo/Other   Parathyroid adenoma  Renal/GU negative Renal ROS     Musculoskeletal negative musculoskeletal ROS (+)    Abdominal   Peds  Hematology negative hematology ROS (+)   Anesthesia Other Findings Day of surgery medications reviewed with the patient.  Reproductive/Obstetrics                              Anesthesia Physical Anesthesia Plan  ASA: 3  Anesthesia Plan: General   Post-op Pain Management: Tylenol PO (pre-op)*   Induction: Intravenous  PONV Risk Score and Plan: 2 and Midazolam, Dexamethasone and Ondansetron  Airway Management Planned: Oral ETT  Additional Equipment:   Intra-op Plan:   Post-operative Plan: Extubation in OR  Informed Consent: I have reviewed the patients History and Physical, chart, labs and discussed the procedure including the risks, benefits and alternatives for the proposed anesthesia with the patient or authorized representative who has indicated his/her understanding and acceptance.     Dental advisory given  Plan Discussed with: CRNA  Anesthesia Plan Comments:          Anesthesia Quick Evaluation

## 2022-09-12 NOTE — H&P (Addendum)
Glenn Torres is an 72 y.o. male.   Chief Complaint: Hyperparathyroidism HPI: 72 year old male with history of hypercalcemia found to be due to hyperparathyroidism.  Ultrasound demonstrated a right upper pole mass.  Past Medical History:  Diagnosis Date   Depression    GERD (gastroesophageal reflux disease)    Migraine-cluster headache syndrome    Parathyroid adenoma    Seizures    Seizures    Pt states he still has seizures even though he is on medication (Last one in April 2024)    Past Surgical History:  Procedure Laterality Date   NASAL SEPTUM SURGERY     deviated septum repair   SPLENECTOMY, TOTAL     TRACHEOSTOMY  1974    History reviewed. No pertinent family history. Social History:  reports that he has been smoking cigarettes. He has been smoking an average of 1 pack per day. He has never used smokeless tobacco. He reports that he does not drink alcohol and does not use drugs.  Allergies:  Allergies  Allergen Reactions   Gabapentin Other (See Comments)    "made pt feel like he was having a seizure"   Gabapentin     "Felt like I was having a seizure"    Penicillins Hives    Medications Prior to Admission  Medication Sig Dispense Refill   acetaminophen (TYLENOL) 500 MG tablet Take 1,000 mg by mouth daily as needed for mild pain or headache.      acetaminophen (TYLENOL) 500 MG tablet Take 500 mg by mouth 2 (two) times daily as needed for moderate pain.     aspirin EC 81 MG tablet Take 81 mg by mouth daily.     budesonide-formoterol (SYMBICORT) 80-4.5 MCG/ACT inhaler Inhale 2 puffs into the lungs 2 (two) times daily.     buPROPion (WELLBUTRIN SR) 150 MG 12 hr tablet Take 150 mg by mouth 2 (two) times daily.     cephALEXin (KEFLEX) 500 MG capsule Take 1 capsule (500 mg total) by mouth 2 (two) times daily. 14 capsule 0   escitalopram (LEXAPRO) 20 MG tablet Take 20 mg by mouth daily.     finasteride (PROSCAR) 5 MG tablet Take 5 mg by mouth daily.      ibuprofen (ADVIL,MOTRIN) 200 MG tablet Take 400 mg by mouth every 6 (six) hours as needed for headache or mild pain.     levETIRAcetam (KEPPRA) 250 MG tablet Take 750-1,000 mg by mouth See admin instructions. Take 750 mg in at 0700 and 1500, and take 1000 mg at 2000     Multiple Vitamin (MULTIVITAMIN WITH MINERALS) TABS tablet Take 1 tablet by mouth daily.     Multiple Vitamins-Minerals (MULTIVITAMIN WITH MINERALS) tablet Take 1 tablet by mouth daily.     Polyvinyl Alcohol-Povidone (REFRESH OP) Place 1 drop into both eyes daily as needed (dry eyes).     prazosin (MINIPRESS) 5 MG capsule Take 5 mg by mouth at bedtime as needed (sleep).     SUMAtriptan (IMITREX) 100 MG tablet Take 100 mg by mouth every 2 (two) hours as needed for migraine. May repeat in 2 hours if headache persists or recurs.     SUMAtriptan (IMITREX) 100 MG tablet Take 100 mg by mouth every 2 (two) hours as needed for migraine. May repeat in 2 hours if headache persists or recurs.     SUMAtriptan (IMITREX) 5 MG/ACT nasal spray Place 1 spray into the nose every 2 (two) hours as needed for migraine.  albuterol (PROVENTIL HFA;VENTOLIN HFA) 108 (90 Base) MCG/ACT inhaler Inhale 1 puff into the lungs daily as needed for wheezing or shortness of breath.      sertraline (ZOLOFT) 100 MG tablet Take 100 mg by mouth daily.     tamsulosin (FLOMAX) 0.4 MG CAPS capsule Take 0.4 mg by mouth daily.     tolnaftate (TINACTIN) 1 % cream Apply 1 application  topically daily.     traZODone (DESYREL) 100 MG tablet Take 100 mg by mouth at bedtime.     traZODone (DESYREL) 100 MG tablet Take 100 mg by mouth at bedtime as needed for sleep.     urea 10 % lotion Apply 1 Application topically as needed for dry skin.      No results found for this or any previous visit (from the past 48 hour(s)). No results found.  Review of Systems  Neurological:  Positive for seizures.  All other systems reviewed and are negative.   Blood pressure 133/77, pulse (!)  56, temperature 97.7 F (36.5 C), temperature source Oral, resp. rate 15, height  (1.727 m), weight 83 kg, SpO2 98 %. Physical Exam Constitutional:      Appearance: Normal appearance. He is normal weight.  HENT:     Head: Normocephalic and atraumatic.     Right Ear: External ear normal.     Left Ear: External ear normal.     Nose: Nose normal.     Mouth/Throat:     Mouth: Mucous membranes are moist.     Pharynx: Oropharynx is clear.  Eyes:     Extraocular Movements: Extraocular movements intact.     Conjunctiva/sclera: Conjunctivae normal.     Pupils: Pupils are equal, round, and reactive to light.  Neck:     Comments: Tracheostomy scar. Cardiovascular:     Rate and Rhythm: Normal rate.  Pulmonary:     Effort: Pulmonary effort is normal.  Musculoskeletal:     Cervical back: Normal range of motion.  Skin:    General: Skin is warm and dry.  Neurological:     General: No focal deficit present.     Mental Status: He is alert and oriented to person, place, and time.  Psychiatric:        Mood and Affect: Mood normal.        Behavior: Behavior normal.        Thought Content: Thought content normal.        Judgment: Judgment normal.      Assessment/Plan Hyperparathyroidism  To OR for parathyroidectomy.  Plan overnight observation.  Christia Reading, MD 09/12/2022, 12:28 PM

## 2022-09-13 ENCOUNTER — Encounter (HOSPITAL_COMMUNITY): Payer: Self-pay | Admitting: Otolaryngology

## 2022-09-13 DIAGNOSIS — D351 Benign neoplasm of parathyroid gland: Secondary | ICD-10-CM | POA: Diagnosis not present

## 2022-09-13 LAB — SURGICAL PATHOLOGY

## 2022-09-13 NOTE — Plan of Care (Signed)
A/Ox4 and on room air. PRN norco given x2. Neck site glued together, also clean, dry, and in tact. Able to swallow pills in applesauce. No overnight issues.    Problem: Education: Goal: Knowledge of the prescribed therapeutic regimen will improve Outcome: Progressing   Problem: Activity: Goal: Ability to tolerate increased activity will improve Outcome: Progressing   Problem: Health Behavior/Discharge Planning: Goal: Identification of resources available to assist in meeting health care needs will improve Outcome: Progressing   Problem: Nutrition: Goal: Maintenance of adequate nutrition will improve Outcome: Progressing   Problem: Clinical Measurements: Goal: Complications related to the disease process, condition or treatment will be avoided or minimized Outcome: Progressing   Problem: Respiratory: Goal: Will regain and/or maintain adequate ventilation Outcome: Progressing   Problem: Skin Integrity: Goal: Demonstration of wound healing without infection will improve Outcome: Progressing   Problem: Education: Goal: Knowledge of General Education information will improve Description: Including pain rating scale, medication(s)/side effects and non-pharmacologic comfort measures Outcome: Progressing   Problem: Health Behavior/Discharge Planning: Goal: Ability to manage health-related needs will improve Outcome: Progressing   Problem: Clinical Measurements: Goal: Ability to maintain clinical measurements within normal limits will improve Outcome: Progressing Goal: Will remain free from infection Outcome: Progressing Goal: Diagnostic test results will improve Outcome: Progressing Goal: Respiratory complications will improve Outcome: Progressing Goal: Cardiovascular complication will be avoided Outcome: Progressing   Problem: Activity: Goal: Risk for activity intolerance will decrease Outcome: Progressing   Problem: Nutrition: Goal: Adequate nutrition will be  maintained Outcome: Progressing   Problem: Coping: Goal: Level of anxiety will decrease Outcome: Progressing   Problem: Elimination: Goal: Will not experience complications related to bowel motility Outcome: Progressing Goal: Will not experience complications related to urinary retention Outcome: Progressing   Problem: Pain Managment: Goal: General experience of comfort will improve Outcome: Progressing   Problem: Safety: Goal: Ability to remain free from injury will improve Outcome: Progressing   Problem: Skin Integrity: Goal: Risk for impaired skin integrity will decrease Outcome: Progressing

## 2022-09-13 NOTE — Discharge Summary (Signed)
Physician Discharge Summary  Patient ID: Glenn Torres MRN: [id number] DOB/AGE: 70/24/1952 70 y.o.  Admit date: 09/12/2022 Discharge date: 09/13/2022  Admission Diagnoses: Hyperparathyroidism  Discharge Diagnoses:  Principal Problem:   Hyperparathyroidism   Discharged Condition: good  Hospital Course: 72 year old male underwent right superior parathyroidectomy.  See operative note.  He was observed overnight and had no specific problems.  He is felt stable for discharge on POD 1.  PTH/Ca lab remains pending.  Consults: None  Significant Diagnostic Studies: None  Treatments: surgery: Right superior parathyroidectomy  Discharge Exam: Blood pressure 126/74, pulse 72, temperature 97.7 F (36.5 C), temperature source Oral, resp. rate 16, height  (1.727 m), weight 83 kg, SpO2 96 %. General appearance: alert, cooperative, and no distress Neck: right incision clean and intact, no fluid collection, normal voice  Disposition: Discharge disposition: 01-Home or Self Care       Discharge Instructions     Diet - low sodium heart healthy   Complete by: As directed    Discharge instructions   Complete by: As directed    Resume regular diet.  Avoid strenuous activity.  OK to allow incision to get wet in shower, gently pat dry.  Do not apply any ointment to the incision.  Treat pain with tylenol.   Increase activity slowly   Complete by: As directed       Allergies as of 09/13/2022       Reactions   Gabapentin Other (See Comments)   "made pt feel like he was having a seizure"   Gabapentin    "Felt like I was having a seizure"    Penicillins Hives        Medication List     TAKE these medications    acetaminophen 500 MG tablet Commonly known as: TYLENOL Take 1,000 mg by mouth daily as needed for mild pain or headache.   acetaminophen 500 MG tablet Commonly known as: TYLENOL Take 500 mg by mouth 2 (two) times daily as needed for moderate pain.    albuterol 108 (90 Base) MCG/ACT inhaler Commonly known as: VENTOLIN HFA Inhale 1 puff into the lungs daily as needed for wheezing or shortness of breath.   aspirin EC 81 MG tablet Take 81 mg by mouth daily.   budesonide-formoterol 80-4.5 MCG/ACT inhaler Commonly known as: SYMBICORT Inhale 2 puffs into the lungs 2 (two) times daily.   buPROPion 150 MG 12 hr tablet Commonly known as: WELLBUTRIN SR Take 150 mg by mouth 2 (two) times daily.   cephALEXin 500 MG capsule Commonly known as: KEFLEX Take 1 capsule (500 mg total) by mouth 2 (two) times daily.   escitalopram 20 MG tablet Commonly known as: LEXAPRO Take 20 mg by mouth daily.   finasteride 5 MG tablet Commonly known as: PROSCAR Take 5 mg by mouth daily.   ibuprofen 200 MG tablet Commonly known as: ADVIL Take 400 mg by mouth every 6 (six) hours as needed for headache or mild pain.   levETIRAcetam 250 MG tablet Commonly known as: KEPPRA Take 750-1,000 mg by mouth See admin instructions. Take 750 mg in at 0700 and 1500, and take 1000 mg at 2000   multivitamin with minerals tablet Take 1 tablet by mouth daily.   multivitamin with minerals Tabs tablet Take 1 tablet by mouth daily.   prazosin 5 MG capsule Commonly known as: MINIPRESS Take 5 mg by mouth at bedtime as needed (sleep).   REFRESH OP Place 1 drop into both eyes  daily as needed (dry eyes).   sertraline 100 MG tablet Commonly known as: ZOLOFT Take 100 mg by mouth daily.   SUMAtriptan 100 MG tablet Commonly known as: IMITREX Take 100 mg by mouth every 2 (two) hours as needed for migraine. May repeat in 2 hours if headache persists or recurs.   SUMAtriptan 100 MG tablet Commonly known as: IMITREX Take 100 mg by mouth every 2 (two) hours as needed for migraine. May repeat in 2 hours if headache persists or recurs.   SUMAtriptan 5 MG/ACT nasal spray Commonly known as: IMITREX Place 1 spray into the nose every 2 (two) hours as needed for migraine.    tamsulosin 0.4 MG Caps capsule Commonly known as: FLOMAX Take 0.4 mg by mouth daily.   tolnaftate 1 % cream Commonly known as: TINACTIN Apply 1 application  topically daily.   traZODone 100 MG tablet Commonly known as: DESYREL Take 100 mg by mouth at bedtime.   traZODone 100 MG tablet Commonly known as: DESYREL Take 100 mg by mouth at bedtime as needed for sleep.   urea 10 % lotion Apply 1 Application topically as needed for dry skin.         Signed: Christia Reading 09/13/2022, 8:01 AM

## 2022-09-14 LAB — PTH, INTACT AND CALCIUM
Calcium, Total (PTH): 10.5 mg/dL — ABNORMAL HIGH (ref 8.6–10.2)
PTH: 10 pg/mL — ABNORMAL LOW (ref 15–65)

## 2022-10-03 ENCOUNTER — Emergency Department: Payer: No Typology Code available for payment source

## 2022-10-03 ENCOUNTER — Emergency Department
Admission: EM | Admit: 2022-10-03 | Discharge: 2022-10-03 | Disposition: A | Discharge status: Home / Self Care | Payer: No Typology Code available for payment source | Attending: Emergency Medicine | Admitting: Emergency Medicine

## 2022-10-03 ENCOUNTER — Other Ambulatory Visit: Payer: Self-pay

## 2022-10-03 ENCOUNTER — Encounter: Payer: Self-pay | Admitting: Emergency Medicine

## 2022-10-03 DIAGNOSIS — R569 Unspecified convulsions: Secondary | ICD-10-CM | POA: Diagnosis present

## 2022-10-03 LAB — URINE DRUG SCREEN, QUALITATIVE (ARMC ONLY)
Amphetamines, Ur Screen: NOT DETECTED
Barbiturates, Ur Screen: NOT DETECTED
Benzodiazepine, Ur Scrn: NOT DETECTED
Cannabinoid 50 Ng, Ur ~~LOC~~: NOT DETECTED
Cocaine Metabolite,Ur ~~LOC~~: NOT DETECTED
MDMA (Ecstasy)Ur Screen: NOT DETECTED
Methadone Scn, Ur: NOT DETECTED
Opiate, Ur Screen: NOT DETECTED
Phencyclidine (PCP) Ur S: NOT DETECTED
Tricyclic, Ur Screen: NOT DETECTED

## 2022-10-03 LAB — CBC
HCT: 42.1 % (ref 39.0–52.0)
Hemoglobin: 14.4 g/dL (ref 13.0–17.0)
MCH: 32.2 pg (ref 26.0–34.0)
MCHC: 34.2 g/dL (ref 30.0–36.0)
MCV: 94.2 fL (ref 80.0–100.0)
Platelets: 225 10*3/uL (ref 150–400)
RBC: 4.47 MIL/uL (ref 4.22–5.81)
RDW: 12.6 % (ref 11.5–15.5)
WBC: 10.2 10*3/uL (ref 4.0–10.5)
nRBC: 0 % (ref 0.0–0.2)

## 2022-10-03 LAB — BASIC METABOLIC PANEL
Anion gap: 7 (ref 5–15)
BUN: 16 mg/dL (ref 8–23)
CO2: 22 mmol/L (ref 22–32)
Calcium: 9.3 mg/dL (ref 8.9–10.3)
Chloride: 109 mmol/L (ref 98–111)
Creatinine, Ser: 1.19 mg/dL (ref 0.61–1.24)
GFR, Estimated: >60 mL/min (ref >60)
Glucose, Bld: 93 mg/dL (ref 70–99)
Potassium: 3.6 mmol/L (ref 3.5–5.1)
Sodium: 138 mmol/L (ref 135–145)

## 2022-10-03 LAB — URINALYSIS, ROUTINE W REFLEX MICROSCOPIC
Bilirubin Urine: NEGATIVE
Glucose, UA: NEGATIVE mg/dL
Hgb urine dipstick: NEGATIVE
Ketones, ur: NEGATIVE mg/dL
Leukocytes,Ua: NEGATIVE
Nitrite: NEGATIVE
Protein, ur: NEGATIVE mg/dL
Specific Gravity, Urine: 1.019 (ref 1.005–1.030)
pH: 5 (ref 5.0–8.0)

## 2022-10-03 LAB — MAGNESIUM: Magnesium: 2 mg/dL (ref 1.7–2.4)

## 2022-10-03 MED ORDER — LEVETIRACETAM IN NACL 1500 MG/100ML IV SOLN
1500.0000 mg | Freq: Once | INTRAVENOUS | Status: AC
  Administered 2022-10-03: 1500 mg via INTRAVENOUS
  Filled 2022-10-03: qty 100

## 2022-10-03 MED ORDER — SODIUM CHLORIDE 0.9 % IV BOLUS
1000.0000 mL | Freq: Once | INTRAVENOUS | Status: AC
  Administered 2022-10-03: 1000 mL via INTRAVENOUS

## 2022-10-03 NOTE — Discharge Instructions (Signed)
You were seen in the emergency room today for evaluation following your multiple seizure-like episodes.  Your blood work here was overall reassuring as well as your CT of your head and C-spine.  Please make sure you keep your follow-up with your primary care doctor tomorrow for further evaluation of your increased episodes and to discuss any changes to your medication.  Return to the ER for new or worsening symptoms.

## 2022-10-03 NOTE — ED Triage Notes (Signed)
Pt to ED via [facility] from home for dizziness and weakness. Pt was mowing the grass and started having dizziness and near syncopal episodes. EMS reports that pt has had several "full body seizures" but pt remains awake during seizures. Pt has hx/o TBI. Pt reports that he had thyroid surgery on 4/17. Pt was given 500 cc of NS. VSS with EMS. CBG 89.

## 2022-10-03 NOTE — ED Provider Notes (Signed)
[facility] Provider Note    Event Date/Time   First MD Initiated Contact with Patient 10/03/22 1356     (approximate)   History   Dizziness   HPI  Glenn Torres is a 72 y.o. male with history of seizure disorder on Keppra presenting to the emergency department for evaluation following multiple seizure-like episodes.  Patient reports that at baseline he has a couple seizures a month.  Today he had 3 seizure-like episodes.  He reports that he was working in his yard earlier today when he had onset of dizziness followed by generalized shaking.  He does report that he did not have control of his body, but was awake during this episode.  This is typical for his seizures.  He denies any missed doses of his Keppra.  He does have follow-up scheduled at the [facility] tomorrow.  Denies chest pain, shortness of breath, recent fevers, sick contacts.     Physical Exam   Triage Vital Signs: ED Triage Vitals  Enc Vitals Group     BP 10/03/22 1326 107/67     Pulse Rate 10/03/22 1324 60     Resp 10/03/22 1324 16     Temp 10/03/22 1324 98.2 F (36.8 C)     Temp Source 10/03/22 1324 Oral     SpO2 10/03/22 1324 98 %     Weight 10/03/22 1324 178 lb (80.7 kg)     Height 10/03/22 1324 5\' 8"  (1.727 m)     Head Circumference --      Peak Flow --      Pain Score 10/03/22 1324 7     Pain Loc --      Pain Edu? --      Excl. in GC? --     Most recent vital signs: Vitals:   10/03/22 1324 10/03/22 1326  BP:  107/67  Pulse: 60   Resp: 16   Temp: 98.2 F (36.8 C)   SpO2: 98%      General: Awake, interactive  CV:  Regular rate, good peripheral perfusion.  Resp:  Lungs clear, unlabored respirations.  Abd:  Soft, nondistended.  Neuro:  [A&O to person, place, time, and situation.] [Normal extraocular movements.] [Symmetric facial movement.] [Sensation intact and equal on face and bilateral upper and lower extremities.] [5/5 strength in bilateral upper and lower  extremities.] [Normal finger to nose testing.]     ED Results / Procedures / Treatments   Labs (all labs ordered are listed, but only abnormal results are displayed) Labs Reviewed  URINALYSIS, ROUTINE W REFLEX MICROSCOPIC - Abnormal; Notable for the following components:      Result Value   Color, Urine YELLOW (*)    APPearance CLEAR (*)    All other components within normal limits  BASIC METABOLIC PANEL  CBC  MAGNESIUM  URINE DRUG SCREEN, QUALITATIVE ([facility] ONLY)     EKG EKG independently reviewed interpreted by myself (ER attending) demonstrates:  EKG demonstrates normal sinus rhythm with incomplete right bundle branch block, rate 60, PR 158, QRS 114, QTc 460  RADIOLOGY Imaging independently reviewed and interpreted by myself demonstrates:  CT head without acute bleed.  CT C-spine without acute cervical spine injury.   PROCEDURES:  Critical Care performed: No  Procedures   MEDICATIONS ORDERED IN ED: Medications  sodium chloride 0.9 % bolus 1,000 mL (1,000 mLs Intravenous New Bag/Given 10/03/22 1502)  levETIRAcetam (KEPPRA) IVPB 1500 mg/ 100 mL premix (0 mg Intravenous Stopped 10/03/22 1518)  IMPRESSION / MDM / ASSESSMENT AND PLAN / ED COURSE  I reviewed the triage vital signs and the nursing notes.  Differential diagnosis includes, but is not limited to, breakthrough seizure secondary to electrolyte abnormality, medication nonadherence, nonepileptic episodes  Patient's presentation is most consistent with acute presentation with potential threat to life or bodily function.  72 year old male presenting to the emergency department for multiple seizure-like episodes.  Interestingly, patient denies alterations in consciousness during these episodes, but reports that he has EEG proven seizure-like episodes and is on Keppra for these.  Given increased frequency of seizures, I did give him a 1.5 g load of Keppra.  At the time of my initial evaluation, patient reports he  was back at his baseline mental status.  He was observed for several hours and did not have any recurrent episodes.  His lab work is overall reassuring.  Sodium 138.  I did discuss the possibility of admission given significant increase in the frequency of his seizures, but patient did prefer to be discharged home and has outpatient follow-up tomorrow.  Since he has remained at his baseline mental status, do not think this is unreasonable.  Strict return precautions were provided.  Patient was discharged in stable condition.      FINAL CLINICAL IMPRESSION(S) / ED DIAGNOSES   Final diagnoses:  Seizure-like activity (HCC)     Rx / DC Orders   ED Discharge Orders     None        Note:  This document was prepared using Dragon voice recognition software and may include unintentional dictation errors.   Trinna Post, MD 10/03/22 [phone]

## 2022-12-10 ENCOUNTER — Encounter: Payer: Self-pay | Admitting: Neurology

## 2022-12-21 IMAGING — CT CT HEAD W/O CM
4 series · 17 of 47 positions shown, 19 images · non-contrast
Comparison: 12/30/2019

CLINICAL DATA: Tremor



[Series 2: head wo · axial · 0.42mm/px · z∈[-69,+51]mm · 7 of 33 slices shown, 9 images]
[im 5/33  brain]
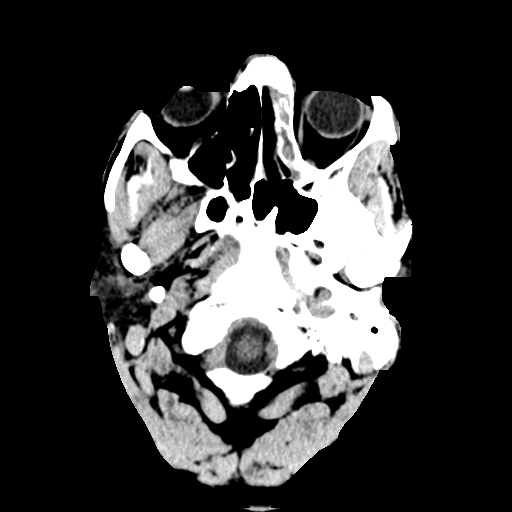
[im 5/33  bone]
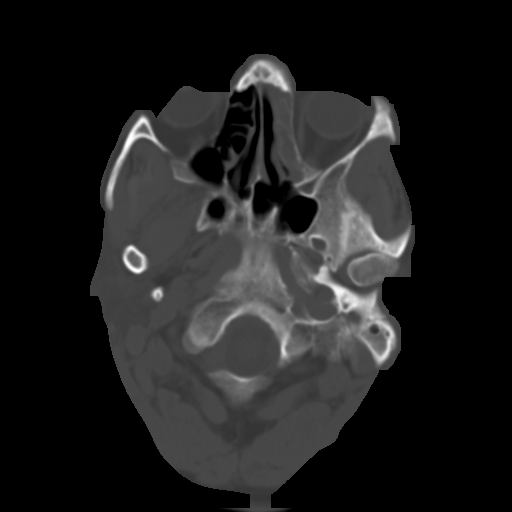
[im 9/33  brain]
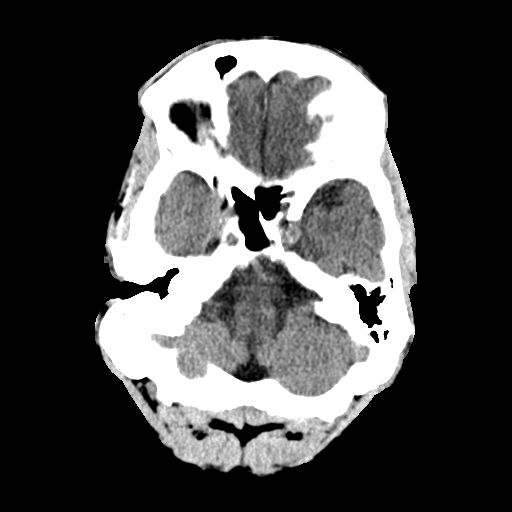
[im 13/33  brain]
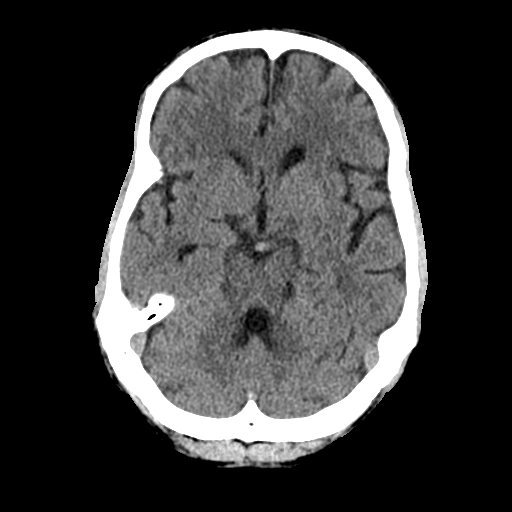
[im 17/33  brain]
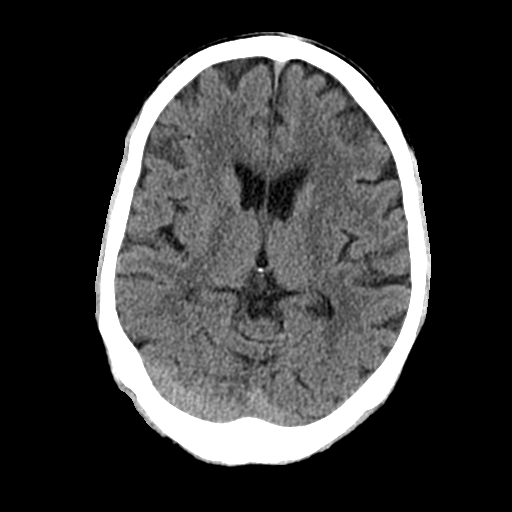
[im 21/33  brain]
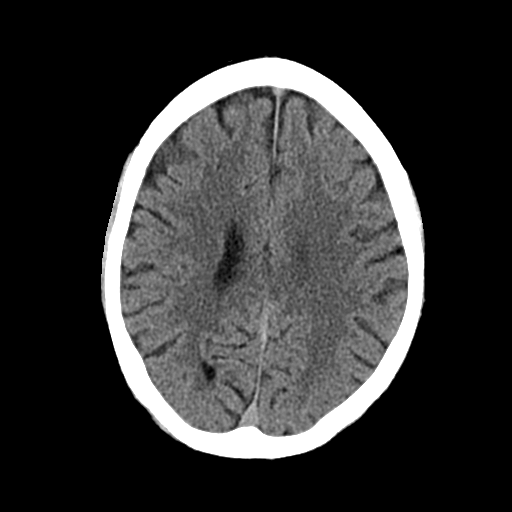
[im 21/33  bone]
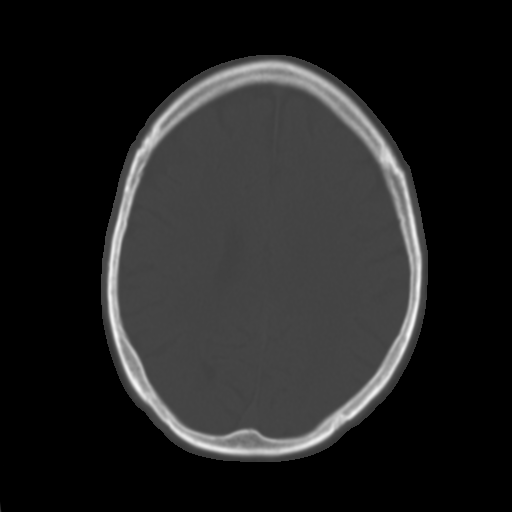
[im 25/33  brain]
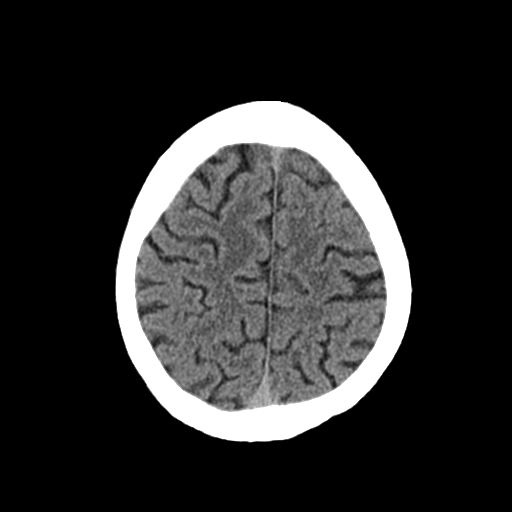
[im 29/33  brain]
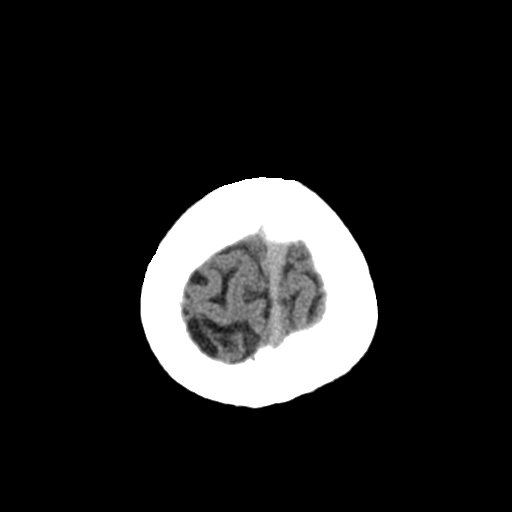

[Series 3: head bone · axial · 0.42mm/px · z∈[-73,-17]mm · 4 of 81 slices shown]
[im 9/81  bone]
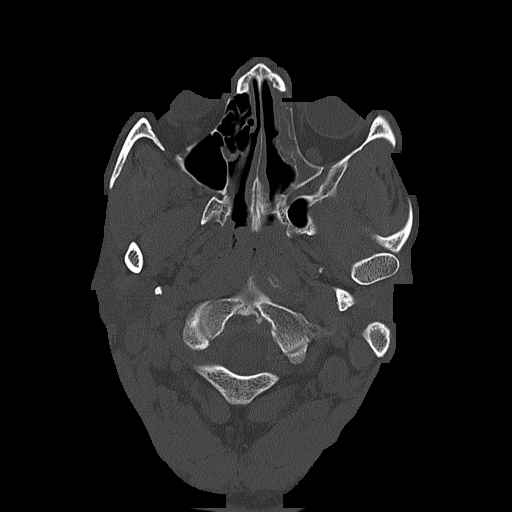
[im 17/81  bone]
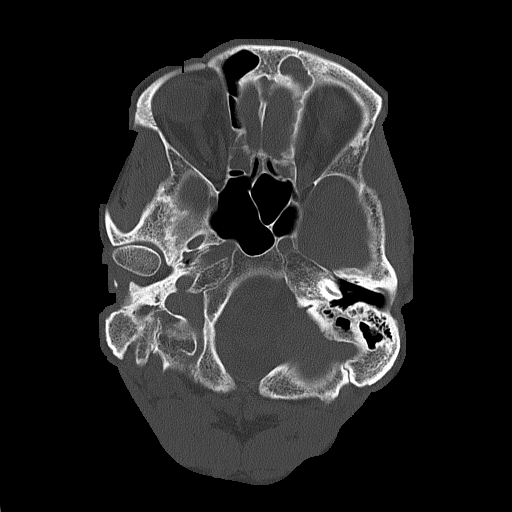
[im 25/81  bone]
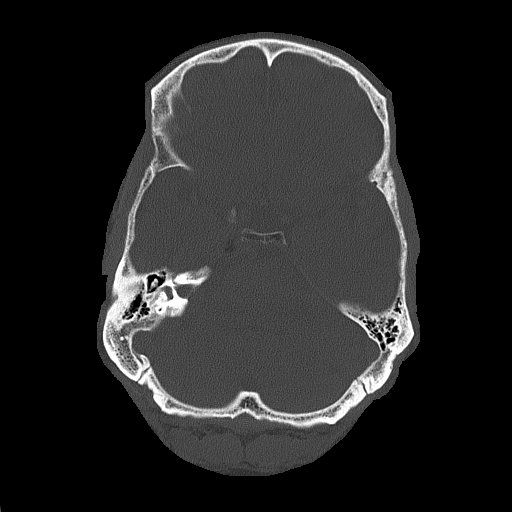
[im 37/81  bone]
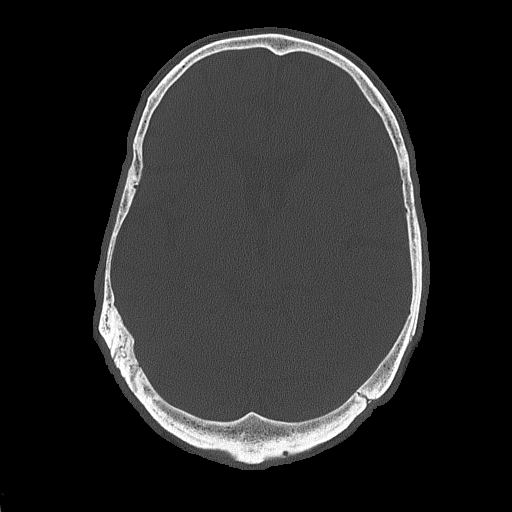

[Series 4: coronal soft tissue · coronal · 0.33mm/px · 3 of 64 slices shown]
[im 22/64  brain]
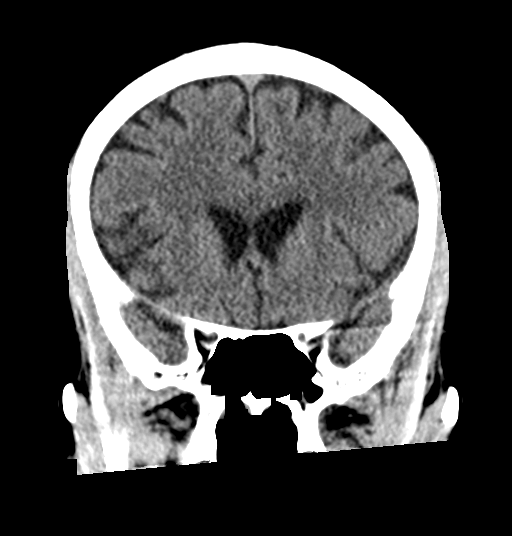
[im 29/64  brain]
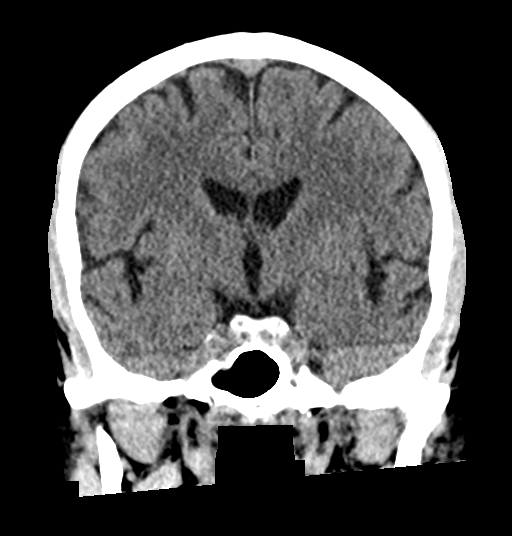
[im 36/64  brain]
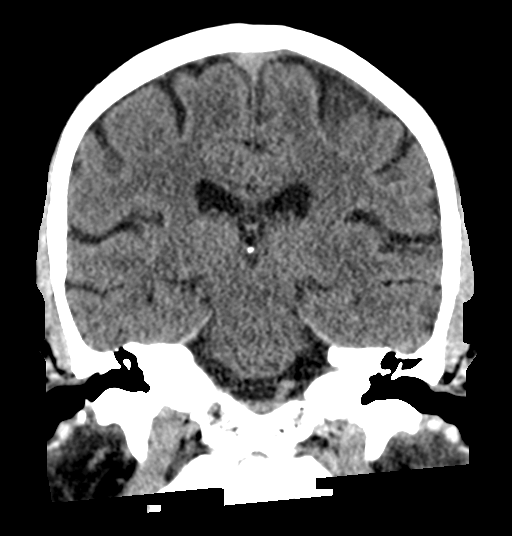

[Series 5: sagittal soft tissue · sagittal · 0.34mm/px · 3 of 57 slices shown]
[im 19/57  brain]
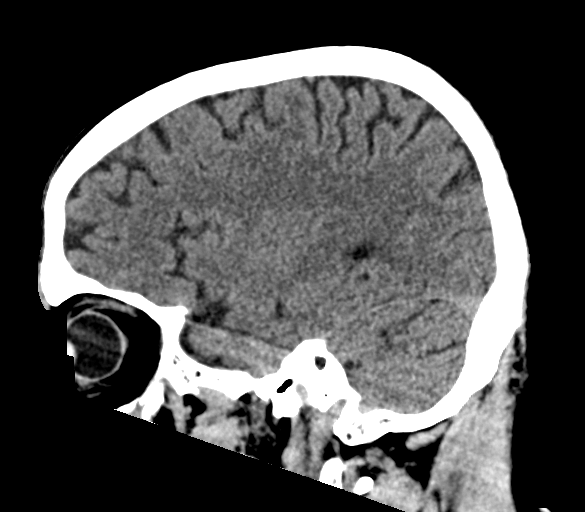
[im 29/57  brain]
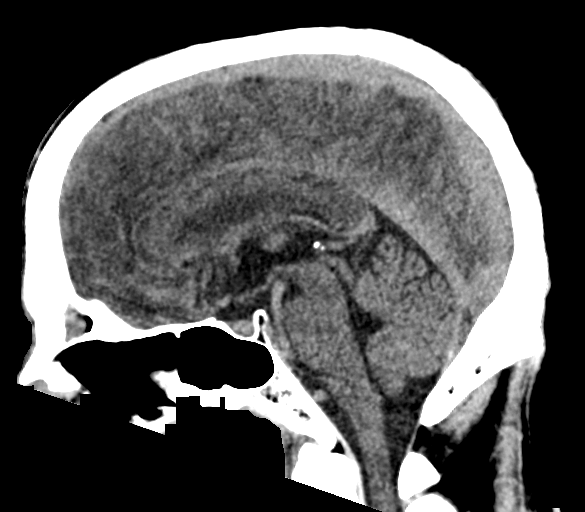
[im 38/57  brain]
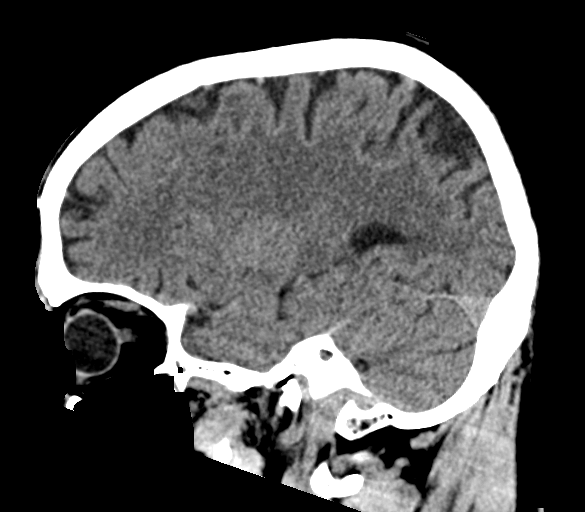

[17 of 47 positions shown; findings below may reference images not displayed]

FINDINGS: Brain: Generalized atrophy. Normal ventricular morphology. No
midline shift or mass effect. Otherwise normal appearance of brain
parenchyma. No intracranial hemorrhage, mass lesion, or evidence of
acute infarction. No extra-axial fluid collections.

Vascular: No hyperdense vessels

Skull: Intact

Sinuses/Orbits: Partially opacified LEFT frontal sinus and LEFT
ethmoid air cells

Other: N/A
IMPRESSION: LEFT ethmoid and frontal sinus disease.

No acute intracranial abnormalities.

## 2023-01-22 ENCOUNTER — Ambulatory Visit (INDEPENDENT_AMBULATORY_CARE_PROVIDER_SITE_OTHER): Payer: No Typology Code available for payment source | Admitting: Neurology

## 2023-01-22 ENCOUNTER — Encounter: Payer: Self-pay | Admitting: Neurology

## 2023-01-22 VITALS — BP 146/79 | HR 79 | Ht 68.0 in | Wt 186.0 lb

## 2023-01-22 DIAGNOSIS — G43009 Migraine without aura, not intractable, without status migrainosus: Secondary | ICD-10-CM | POA: Diagnosis not present

## 2023-01-22 DIAGNOSIS — G40909 Epilepsy, unspecified, not intractable, without status epilepticus: Secondary | ICD-10-CM | POA: Diagnosis not present

## 2023-01-22 NOTE — Patient Instructions (Signed)
Good to meet you.  Schedule MRI brain with and without contrast  2. Schedule 1-hour EEG  3. Continue Keppra 750mg  in AM, 750mg  in afternoon, 1000mg  at bedtime  4. Follow-up in 4 months, call for any changes   Seizure Precautions: 1. If medication has been prescribed for you to prevent seizures, take it exactly as directed.  Do not stop taking the medicine without talking to your doctor first, even if you have not had a seizure in a long time.   2. Avoid activities in which a seizure would cause danger to yourself or to others.  Don't operate dangerous machinery, swim alone, or climb in high or dangerous places, such as on ladders, roofs, or girders.  Do not drive unless your doctor says you may.  3. If you have any warning that you may have a seizure, lay down in a safe place where you can't hurt yourself.    4.  No driving for 6 months from last seizure, as per [facility].   Please refer to the following link on the [employer]'s website for more information: http://www.epilepsyfoundation.org/answerplace/Social/driving/drivingu.cfm   5.  Maintain good sleep hygiene.  6.  Contact your doctor if you have any problems that may be related to the medicine you are taking.  7.  Call 911 and bring the patient back to the ED if:        A.  The seizure lasts longer than 5 minutes.       B.  The patient doesn't awaken shortly after the seizure  C.  The patient has new problems such as difficulty seeing, speaking or moving  D.  The patient was injured during the seizure  E.  The patient has a temperature over 102 F (39C)  F.  The patient vomited and now is having trouble breathing
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
## 2023-01-29 ENCOUNTER — Encounter: Payer: Self-pay | Admitting: Neurology

## 2023-01-29 ENCOUNTER — Other Ambulatory Visit: Payer: No Typology Code available for payment source

## 2023-01-29 DIAGNOSIS — Z029 Encounter for administrative examinations, unspecified: Secondary | ICD-10-CM

## 2023-02-11 ENCOUNTER — Telehealth: Payer: Self-pay

## 2023-02-11 ENCOUNTER — Ambulatory Visit (INDEPENDENT_AMBULATORY_CARE_PROVIDER_SITE_OTHER): Payer: No Typology Code available for payment source | Admitting: Neurology

## 2023-02-11 DIAGNOSIS — G40909 Epilepsy, unspecified, not intractable, without status epilepticus: Secondary | ICD-10-CM | POA: Diagnosis not present

## 2023-02-11 NOTE — Progress Notes (Unsigned)
EEG complete - results pending 

## 2023-02-11 NOTE — Telephone Encounter (Signed)
Patient finished with EEG test and before leaving I took VS BP at 150/78, 99o2, and 70 pulse. He stood up after sitting on the side of the bed and had to set right back down reported the he had gotten light headed. He sit for a little while and stood up and he was okay, I told him that he needs to go to the ED for an assessment. He said no. The EEG tech and I told him this so we told him he needs to see and make an appointment with his PCP and he agreed. The EEG tech walked him to his car.

## 2023-02-11 NOTE — Progress Notes (Unsigned)
Walked pt downstairs and advised him that if he begins to feel worse or the chest pain comes back and persists - he needs to go to the emergency room.

## 2023-02-11 NOTE — Progress Notes (Unsigned)
Pt came in today stated that he had chest pains head ache and eye pains. Pt VS were checked pt was moved to the EEG room. Pt placed on 2L of oxygen to help slow his breathing and to help with his chest pain. DR Karel Jarvis was called to the bed side where she did an evaluation, pt stated that chest pain started 2 weeks ago, pt is going to continue with EEG pt advised to let us know if symptoms get worse will monitor VS..

## 2023-02-27 NOTE — Procedures (Signed)
ELECTROENCEPHALOGRAM REPORT  Date of Study: 02/11/2023  Patient's Name: Glenn Torres MRN: [id number] Date of Birth: 03/18/1951  Referring Provider: Dr. Patrcia Dolly  Clinical History: This is a 72 year old man with episodes of staring and shaking but there appears to be some retained awareness. EEG for classification.  Seizure Medications: Keppra  Technical Summary: A multichannel digital 1-hour EEG recording measured by the international 10-20 system with electrodes applied with paste and impedances below 5000 ohms performed in our laboratory with EKG monitoring in an awake and asleep patient.  Hyperventilation was not performed. Photic stimulation was performed.  The digital EEG was referentially recorded, reformatted, and digitally filtered in a variety of bipolar and referential montages for optimal display.    Description: The patient is awake and asleep during the recording.  During maximal wakefulness, there is a symmetric, medium voltage 9-10 Hz posterior dominant rhythm that attenuates with eye opening.  The record is symmetric.  During drowsiness and sleep, there is an increase in theta slowing of the background.  Vertex waves and symmetric sleep spindles were seen. Hyperventilation and photic stimulation did not elicit any abnormalities.  There were no epileptiform discharges or electrographic seizures seen.    EKG lead was unremarkable.  Impression: This 1-hour awake and asleep EEG is normal.    Clinical Correlation: A normal EEG does not exclude a clinical diagnosis of epilepsy.  If further clinical questions remain, prolonged EEG may be helpful.  Clinical correlation is advised.   Patrcia Dolly, M.D.

## 2023-03-15 ENCOUNTER — Ambulatory Visit
Admission: RE | Admit: 2023-03-15 | Discharge: 2023-03-15 | Disposition: A | Discharge status: Home / Self Care | Payer: No Typology Code available for payment source | Source: Ambulatory Visit | Attending: Neurology | Admitting: Neurology

## 2023-03-15 DIAGNOSIS — G40909 Epilepsy, unspecified, not intractable, without status epilepticus: Secondary | ICD-10-CM

## 2023-03-15 MED ORDER — GADOPICLENOL 0.5 MMOL/ML IV SOLN
8.5000 mL | Freq: Once | INTRAVENOUS | Status: AC | PRN
  Administered 2023-03-15: 8.5 mL via INTRAVENOUS

## 2023-04-01 ENCOUNTER — Telehealth: Payer: Self-pay | Admitting: Neurology

## 2023-04-01 NOTE — Telephone Encounter (Signed)
Pt called an informed that brain MRI did not show any tumor, bleed, or new stroke. It showed age related changes, no change from his last brain scan. His EEG is also normal. How is he feeling? Today is a good day,  Last week he was having chest pains with numbness in left arm and heaviness. Pt has cardio appointment coming up soon he wanted Dr Karel Jarvis to be aware of

## 2023-04-01 NOTE — Telephone Encounter (Signed)
Pt called in returning a call about his results.

## 2023-04-01 NOTE — Telephone Encounter (Signed)
Patient calling back for results

## 2023-04-01 NOTE — Telephone Encounter (Signed)
Pt called no answer left a voice mail to call the office back  °

## 2023-05-24 ENCOUNTER — Ambulatory Visit: Payer: No Typology Code available for payment source | Admitting: Neurology

## 2023-05-24 ENCOUNTER — Encounter: Payer: Self-pay | Admitting: Neurology

## 2023-05-24 DIAGNOSIS — Z029 Encounter for administrative examinations, unspecified: Secondary | ICD-10-CM

## 2023-07-30 ENCOUNTER — Other Ambulatory Visit: Payer: Self-pay

## 2023-07-30 ENCOUNTER — Emergency Department

## 2023-07-30 ENCOUNTER — Emergency Department
Admission: EM | Admit: 2023-07-30 | Discharge: 2023-07-30 | Disposition: A | Discharge status: Home / Self Care | Attending: Student in an Organized Health Care Education/Training Program | Admitting: Student in an Organized Health Care Education/Training Program

## 2023-07-30 DIAGNOSIS — F17200 Nicotine dependence, unspecified, uncomplicated: Secondary | ICD-10-CM | POA: Insufficient documentation

## 2023-07-30 DIAGNOSIS — R5381 Other malaise: Secondary | ICD-10-CM | POA: Diagnosis present

## 2023-07-30 DIAGNOSIS — J101 Influenza due to other identified influenza virus with other respiratory manifestations: Secondary | ICD-10-CM | POA: Diagnosis not present

## 2023-07-30 DIAGNOSIS — J111 Influenza due to unidentified influenza virus with other respiratory manifestations: Secondary | ICD-10-CM

## 2023-07-30 LAB — COMPREHENSIVE METABOLIC PANEL
ALT: 22 U/L (ref 0–44)
AST: 32 U/L (ref 15–41)
Albumin: 4.1 g/dL (ref 3.5–5.0)
Alkaline Phosphatase: 60 U/L (ref 38–126)
Anion gap: 8 (ref 5–15)
BUN: 14 mg/dL (ref 8–23)
CO2: 25 mmol/L (ref 22–32)
Calcium: 9.2 mg/dL (ref 8.9–10.3)
Chloride: 104 mmol/L (ref 98–111)
Creatinine, Ser: 1.3 mg/dL — ABNORMAL HIGH (ref 0.61–1.24)
GFR, Estimated: 59 mL/min — ABNORMAL LOW (ref >60)
Glucose, Bld: 94 mg/dL (ref 70–99)
Potassium: 4.2 mmol/L (ref 3.5–5.1)
Sodium: 137 mmol/L (ref 135–145)
Total Bilirubin: 1.5 mg/dL — ABNORMAL HIGH (ref 0.0–1.2)
Total Protein: 7.6 g/dL (ref 6.5–8.1)

## 2023-07-30 LAB — CBC WITH DIFFERENTIAL/PLATELET
Abs Immature Granulocytes: 0.03 10*3/uL (ref 0.00–0.07)
Basophils Absolute: 0.1 10*3/uL (ref 0.0–0.1)
Basophils Relative: 0 %
Eosinophils Absolute: 0.1 10*3/uL (ref 0.0–0.5)
Eosinophils Relative: 1 %
HCT: 44.5 % (ref 39.0–52.0)
Hemoglobin: 15.1 g/dL (ref 13.0–17.0)
Immature Granulocytes: 0 %
Lymphocytes Relative: 10 %
Lymphs Abs: 1.2 10*3/uL (ref 0.7–4.0)
MCH: 32 pg (ref 26.0–34.0)
MCHC: 33.9 g/dL (ref 30.0–36.0)
MCV: 94.3 fL (ref 80.0–100.0)
Monocytes Absolute: 0.6 10*3/uL (ref 0.1–1.0)
Monocytes Relative: 5 %
Neutro Abs: 10.3 10*3/uL — ABNORMAL HIGH (ref 1.7–7.7)
Neutrophils Relative %: 84 %
Platelets: 235 10*3/uL (ref 150–400)
RBC: 4.72 MIL/uL (ref 4.22–5.81)
RDW: 13.2 % (ref 11.5–15.5)
WBC: 12.4 10*3/uL — ABNORMAL HIGH (ref 4.0–10.5)
nRBC: 0 % (ref 0.0–0.2)

## 2023-07-30 LAB — RESP PANEL BY RT-PCR (RSV, FLU A&B, COVID)  RVPGX2
Influenza A by PCR: POSITIVE — AB
Influenza B by PCR: NEGATIVE
Resp Syncytial Virus by PCR: NEGATIVE
SARS Coronavirus 2 by RT PCR: NEGATIVE

## 2023-07-30 LAB — LACTIC ACID, PLASMA: Lactic Acid, Venous: 1 mmol/L (ref 0.5–1.9)

## 2023-07-30 MED ORDER — PREDNISONE 20 MG PO TABS: 40.0000 mg | ORAL_TABLET | Freq: Every day | ORAL | 0 refills | Status: AC

## 2023-07-30 MED ORDER — SODIUM CHLORIDE 0.9 % IV BOLUS
1000.0000 mL | Freq: Once | INTRAVENOUS | Status: AC
  Administered 2023-07-30: 1000 mL via INTRAVENOUS

## 2023-07-30 MED ORDER — IPRATROPIUM-ALBUTEROL 0.5-2.5 (3) MG/3ML IN SOLN
3.0000 mL | Freq: Once | RESPIRATORY_TRACT | Status: AC
  Administered 2023-07-30: 3 mL via RESPIRATORY_TRACT
  Filled 2023-07-30: qty 3

## 2023-07-30 MED ORDER — ALBUTEROL SULFATE HFA 108 (90 BASE) MCG/ACT IN AERS
2.0000 | INHALATION_SPRAY | Freq: Four times a day (QID) | RESPIRATORY_TRACT | 2 refills | Status: AC | PRN
Start: 2023-07-30 — End: ?

## 2023-07-30 MED ORDER — OSELTAMIVIR PHOSPHATE 75 MG PO CAPS: 75.0000 mg | ORAL_CAPSULE | Freq: Two times a day (BID) | ORAL | 0 refills | Status: AC
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
## 2023-07-30 NOTE — ED Notes (Signed)
 See triage note. Presents with low grade temp ,headache,sore throat and body aches  States sx's started 2 days ago  Also had cough

## 2023-07-30 NOTE — ED Triage Notes (Signed)
 Pt to ED for cough and body aches since yesterday. Pt smokes 1 pack per day since 52 years. Pt is ambulatory. Respirations unlabored.

## 2023-12-20 ENCOUNTER — Encounter: Payer: Self-pay | Admitting: Neurology

## 2023-12-25 ENCOUNTER — Ambulatory Visit: Payer: No Typology Code available for payment source | Admitting: Neurology

## 2024-02-05 ENCOUNTER — Ambulatory Visit: Admitting: Neurology

## 2024-02-05 ENCOUNTER — Encounter: Payer: Self-pay | Admitting: Neurology

## 2024-03-28 ENCOUNTER — Emergency Department

## 2024-03-28 ENCOUNTER — Emergency Department
Admission: EM | Admit: 2024-03-28 | Discharge: 2024-03-28 | Disposition: A | Discharge status: Home / Self Care | Attending: Emergency Medicine | Admitting: Emergency Medicine

## 2024-03-28 ENCOUNTER — Other Ambulatory Visit: Payer: Self-pay

## 2024-03-28 DIAGNOSIS — R569 Unspecified convulsions: Secondary | ICD-10-CM | POA: Insufficient documentation

## 2024-03-28 DIAGNOSIS — M542 Cervicalgia: Secondary | ICD-10-CM | POA: Insufficient documentation

## 2024-03-28 LAB — MAGNESIUM: Magnesium: 2.2 mg/dL (ref 1.7–2.4)

## 2024-03-28 LAB — CBC
HCT: 41.7 % (ref 39.0–52.0)
Hemoglobin: 14.1 g/dL (ref 13.0–17.0)
MCH: 32.6 pg (ref 26.0–34.0)
MCHC: 33.8 g/dL (ref 30.0–36.0)
MCV: 96.5 fL (ref 80.0–100.0)
Platelets: 242 K/uL (ref 150–400)
RBC: 4.32 MIL/uL (ref 4.22–5.81)
RDW: 13.2 % (ref 11.5–15.5)
WBC: 9.4 K/uL (ref 4.0–10.5)
nRBC: 0 % (ref 0.0–0.2)

## 2024-03-28 LAB — BASIC METABOLIC PANEL WITH GFR
Anion gap: 9 (ref 5–15)
BUN: 15 mg/dL (ref 8–23)
CO2: 26 mmol/L (ref 22–32)
Calcium: 9 mg/dL (ref 8.9–10.3)
Chloride: 104 mmol/L (ref 98–111)
Creatinine, Ser: 1.08 mg/dL (ref 0.61–1.24)
GFR, Estimated: >60 mL/min (ref >60)
Glucose, Bld: 99 mg/dL (ref 70–99)
Potassium: 4.1 mmol/L (ref 3.5–5.1)
Sodium: 139 mmol/L (ref 135–145)

## 2024-03-28 LAB — CK: Total CK: 193 U/L (ref 49–397)

## 2024-03-28 MED ORDER — LEVETIRACETAM (KEPPRA) 500 MG/5 ML ADULT IV PUSH
1500.0000 mg | Freq: Once | INTRAVENOUS | Status: AC
  Administered 2024-03-28: 1500 mg via INTRAVENOUS
  Filled 2024-03-28: qty 15

## 2024-03-28 NOTE — Discharge Instructions (Addendum)
 You were seen in the emergency department following a seizure.  You were given IV Keppra  in the emergency department.  You can take your nighttime dose of Keppra  1000 mg.  It is okay to miss your 3 PM dose since you got an IV dose while you are in the emergency department.  Your electrolytes were overall reassuring.  The CT scan of your head and neck did not show any new abnormalities.  Call your neurologist to discuss your breakthrough seizure and whether you need a change of any of your seizure medications.

## 2024-03-28 NOTE — ED Notes (Signed)
 Seizure pads in place

## 2024-03-28 NOTE — ED Triage Notes (Signed)
 Pt BIB [facility] from [employer] [facility] for seizures.  3 SEIZURES in total, 2 with EMS lasting 5 seconds each and one in the restaurant. Pt takes keppra  and is compliant with medication. EMS gave 2.5mg  Versed . Alert and oriented.   129/81 71 pulse 115 cbg

## 2024-03-28 NOTE — ED Provider Notes (Signed)
 [facility] Provider Note    Event Date/Time   First MD Initiated Contact with Patient 03/28/24 1100     (approximate)   History   Seizures   HPI  Glenn Torres is a 73 y.o. male past medical history significant for seizure disorder who presents to the emergency department with a seizure.  Patient states that he had a couple of seizures just prior to arrival.  3 seizures in total.  Was given Versed  with EMS.  2 seizures with EMS that lasted for approximately 5 seconds each.  1 was in the restaurant.  Patient states he is feeling back to his normal.  States that he is taking Keppra  and has been compliant with his medications.  Recently got a new neurologist and had an increase of his Keppra .  States that he will go multiple years without having a seizure and then have a couple in a row.  Denies feeling poorly recently denies any fever, chills, nausea, vomiting.  Denies any chest pain or shortness of breath.  Denies any dysuria, urinary urgency or frequency.  Mild headache.     Physical Exam   Triage Vital Signs: ED Triage Vitals  Encounter Vitals Group     BP --      Girls Systolic BP Percentile --      Girls Diastolic BP Percentile --      Boys Systolic BP Percentile --      Boys Diastolic BP Percentile --      Pulse Rate 03/28/24 1102 72     Resp 03/28/24 1102 18     Temp 03/28/24 1102 (!) 97.4 F (36.3 C)     Temp Source 03/28/24 1102 Oral     SpO2 03/28/24 1102 97 %     Weight 03/28/24 1103 178 lb (80.7 kg)     Height 03/28/24 1103 5' 8 (1.727 m)     Head Circumference --      Peak Flow --      Pain Score 03/28/24 1103 0     Pain Loc --      Pain Education --      Exclude from Growth Chart --     Most recent vital signs: Vitals:   03/28/24 1200 03/28/24 1242  BP: 114/78 (!) 109/91  Pulse: 66 (!) 122  Resp: 20   Temp:    SpO2: 94% 98%    Physical Exam Constitutional:      Appearance: He is well-developed.  HENT:      Head: Atraumatic.  Eyes:     Conjunctiva/sclera: Conjunctivae normal.  Neck:     Comments: Mild tenderness to palpation of the neck. Cardiovascular:     Rate and Rhythm: Regular rhythm.  Pulmonary:     Effort: No respiratory distress.  Musculoskeletal:        General: Normal range of motion.     Cervical back: Normal range of motion. Tenderness present.     Right lower leg: No edema.     Left lower leg: No edema.  Skin:    General: Skin is warm.     Capillary Refill: Capillary refill takes less than 2 seconds.  Neurological:     Mental Status: He is alert. Mental status is at baseline.     GCS: GCS eye subscore is 4. GCS verbal subscore is 5. GCS motor subscore is 6.     Cranial Nerves: Cranial nerves 2-12 are intact.     Sensory: Sensation is  intact.     Motor: Motor function is intact.     IMPRESSION / MDM / ASSESSMENT AND PLAN / ED COURSE  I reviewed the triage vital signs and the nursing notes.  Differential diagnosis including breakthrough seizure, intracranial hemorrhage, cervical spine fracture, metabolic abnormality, dysrhythmia  Patient is back to his baseline.  Given IV Keppra .  EKG  I, Clotilda Punter, the attending physician, personally viewed and interpreted this ECG.   Rate: Normal  Rhythm: Normal sinus  Axis: Normal  Intervals: Normal  ST&T Change: None  No tachycardic or bradycardic dysrhythmias while on cardiac telemetry.  RADIOLOGY I independently reviewed imaging, my interpretation of imaging: CT scan of the head with no findings of acute intracranial hemorrhage.  CT scan of head was read as no acute findings  CT cervical spine read as no acute findings  LABS (all labs ordered are listed, but only abnormal results are displayed) Labs interpreted as -    Labs Reviewed  CBC  BASIC METABOLIC PANEL WITH GFR  MAGNESIUM  CK     MDM    Lab work overall reassuring no significant leukocytosis.  Hemoglobin is stable.  Creatinine is at  baseline with no significant electrolyte abnormalities.  Patient is back to his baseline has a nonfocal neurologic exam.  States he is feeling much better and ready to go home.  States that he would follow-up with his neurologist.  Headache improved.  No symptoms of meningitis.  No concern for ligamentous injury.  Discussed return precautions for any worsening symptoms.  No questions or concerns at time of discharge.   PROCEDURES:  Critical Care performed: No  Procedures  Patient's presentation is most consistent with acute presentation with potential threat to life or bodily function.   MEDICATIONS ORDERED IN ED: Medications  levETIRAcetam  (KEPPRA ) undiluted injection 1,500 mg (1,500 mg Intravenous Given 03/28/24 1127)    FINAL CLINICAL IMPRESSION(S) / ED DIAGNOSES   Final diagnoses:  Seizure (HCC)     Rx / DC Orders   ED Discharge Orders     None        Note:  This document was prepared using Dragon voice recognition software and may include unintentional dictation errors.   Punter Clotilda, MD 03/28/24 1549

## 2024-05-27 ENCOUNTER — Emergency Department: Admission: EM | Admit: 2024-05-27 | Discharge: 2024-05-27 | Disposition: A | Discharge status: Home / Self Care

## 2024-05-27 DIAGNOSIS — G40909 Epilepsy, unspecified, not intractable, without status epilepticus: Secondary | ICD-10-CM | POA: Diagnosis not present

## 2024-05-27 DIAGNOSIS — R569 Unspecified convulsions: Secondary | ICD-10-CM | POA: Diagnosis present

## 2024-05-27 LAB — COMPREHENSIVE METABOLIC PANEL WITH GFR
ALT: 16 U/L (ref 0–44)
AST: 26 U/L (ref 15–41)
Albumin: 4.3 g/dL (ref 3.5–5.0)
Alkaline Phosphatase: 65 U/L (ref 38–126)
Anion gap: 9 (ref 5–15)
BUN: 15 mg/dL (ref 8–23)
CO2: 26 mmol/L (ref 22–32)
Calcium: 10.2 mg/dL (ref 8.9–10.3)
Chloride: 104 mmol/L (ref 98–111)
Creatinine, Ser: 1.11 mg/dL (ref 0.61–1.24)
GFR, Estimated: >60 mL/min
Glucose, Bld: 88 mg/dL (ref 70–99)
Potassium: 4.3 mmol/L (ref 3.5–5.1)
Sodium: 138 mmol/L (ref 135–145)
Total Bilirubin: 0.8 mg/dL (ref 0.0–1.2)
Total Protein: 7.3 g/dL (ref 6.5–8.1)

## 2024-05-27 LAB — CBC
HCT: 44.5 % (ref 39.0–52.0)
Hemoglobin: 15.3 g/dL (ref 13.0–17.0)
MCH: 32.9 pg (ref 26.0–34.0)
MCHC: 34.4 g/dL (ref 30.0–36.0)
MCV: 95.7 fL (ref 80.0–100.0)
Platelets: 246 K/uL (ref 150–400)
RBC: 4.65 MIL/uL (ref 4.22–5.81)
RDW: 13 % (ref 11.5–15.5)
WBC: 12 K/uL — ABNORMAL HIGH (ref 4.0–10.5)
nRBC: 0 % (ref 0.0–0.2)

## 2024-05-27 NOTE — Discharge Instructions (Signed)

## 2024-05-27 NOTE — ED Triage Notes (Signed)
 Pt presents to the ED via [facility] from the grocery store. EMS was called out for a seizure. Pt has a hx of same and takes keppra . This RN witnessed one of patient's seizure like episodes. Pt threw himself sideways almost falling out of the recliner and started shaking and immediately started talking. Per EMS pt had like five of these episodes. Pt has bilateral leg heaviness. Pt A&Ox4 at this time.  124/78 HR 80 97% RA ZCBG 101

## 2024-05-27 NOTE — ED Notes (Signed)
 Placed seizure pads in place. Patient able to walk from recliner to bathroom to stretcher in room 40.

## 2024-05-27 NOTE — ED Provider Notes (Signed)
 "  [facility] Provider Note    Event Date/Time   First MD Initiated Contact with Patient 05/27/24 1145     (approximate)   History   Seizures   HPI  Glenn Torres is a 73 y.o. male  past medical history significant for seizure disorder who presents to the emergency department with a seizure.  Patient states that he has been compliant with his Keppra  and his last seizure was over a month ago.  Reports an intermittent history of breakthrough seizures but states that he is working with his neurologist which he has an appointment scheduled for later this month with.  Denies any alcohol  use.  Denies any decreased sleep.  He states that he feels 100% better since he has been he will and feels ready to leave.  He presents with his son who states that the patient is at his baseline mental status.  Of note while the patient was in triage, patient had a seizure-like episode which nursing does not think was consistent with epilepsy      Physical Exam   Triage Vital Signs: ED Triage Vitals  Encounter Vitals Group     BP 05/27/24 1123 118/75     Girls Systolic BP Percentile --      Girls Diastolic BP Percentile --      Boys Systolic BP Percentile --      Boys Diastolic BP Percentile --      Pulse Rate 05/27/24 1123 75     Resp 05/27/24 1123 18     Temp 05/27/24 1123 (!) 97.5 F (36.4 C)     Temp Source 05/27/24 1123 Oral     SpO2 05/27/24 1123 99 %     Weight 05/27/24 1121 176 lb 5.9 oz (80 kg)     Height 05/27/24 1121 5' 8 (1.727 m)     Head Circumference --      Peak Flow --      Pain Score 05/27/24 1121 0     Pain Loc --      Pain Education --      Exclude from Growth Chart --     Most recent vital signs: Vitals:   05/27/24 1123 05/27/24 1200  BP: 118/75 129/69  Pulse: 75 71  Resp: 18 14  Temp: (!) 97.5 F (36.4 C)   SpO2: 99% 96%    Nursing Triage Note reviewed. Vital signs reviewed and patients oxygen saturation is  normoxic  General: Patient is well nourished, well developed, awake and alert, resting comfortably in no acute distress Head: Normocephalic and atraumatic Eyes: Normal inspection, extraocular muscles intact, no conjunctival pallor Ear, nose, throat: Normal external exam Neck: Normal range of motion Respiratory: Patient is in no respiratory distress, lungs CTAB Cardiovascular: Patient is not tachycardic, RRR without murmur appreciated GI: Abd SNT with no guarding or rebound  Back: Normal inspection of the back with good strength and range of motion throughout all ext Extremities: pulses intact with good cap refills, no LE pitting edema or calf tenderness Neuro: The patient is alert and oriented to person, place, and time, appropriately conversive, with 5/5 bilat UE/LE strength, no gross motor or sensory defects noted. Coordination appears to be adequate.  Ambulates without ataxia Skin: Warm, dry, and intact Psych: normal mood and affect, no SI or HI  ED Results / Procedures / Treatments   Labs (all labs ordered are listed, but only abnormal results are displayed) Labs Reviewed  CBC - Abnormal; Notable for  the following components:      Result Value   WBC 12.0 (*)    All other components within normal limits  COMPREHENSIVE METABOLIC PANEL WITH GFR     EKG EKG and rhythm strip are interpreted by myself:   EKG: [Normal sinus rhythm] at heart rate of 75, normal QRS duration, QTc 466, nonspecific ST segments and T waves no ectopy EKG not consistent with Acute STEMI Rhythm strip: NSR in lead II   RADIOLOGY None   PROCEDURES:  Critical Care performed: No  Procedures   MEDICATIONS ORDERED IN ED: Medications - No data to display   IMPRESSION / MDM / ASSESSMENT AND PLAN / ED COURSE                                Differential diagnosis includes, but is not limited to: Acute epilepsy, breakthrough seizure, nonepileptic seizure-like activity, electrolyte derangement anemia       ED Course: Patient is well-appearing without any focal neurological deficits.  He had no profound electrolyte derangements and only a very mild leukocytosis.  EKG demonstrated no evidence of arrhythmia.  Patient has been compliant with his Keppra  and is not due for another dose until 3 PM.  He feels comfortable returning home and following up with his neurologist which I think is reasonable.  He was advised against driving or being placed in positions of peril such as on ladders or in bathtubs.  He voiced understanding and willingness to comply.  His son will drive him home   Clinical Course as of 05/27/24 1539  Wed May 27, 2024  1202 Patient requested discharge prior to his CMP being collected.  Given that he is back to baseline I think this is reasonable [HD]  1213 Comprehensive metabolic panel - if new onset seizures No electrolyte derangements [HD]  1213 CBC - if new onset seizures(!) Only mild leukocytosis [HD]    Clinical Course User Index [HD] Nicholaus Rolland BRAVO, MD   At time of discharge there is no evidence of acute life, limb, vision, or fertility threat. Patient has stable vital signs, pain is well controlled, patient is ambulatory and p.o. tolerant.  Discharge instructions were completed using the EPIC system. I would refer you to those at this time. All warnings prescriptions follow-up etc. were discussed in detail with the patient. Patient indicates understanding and is agreeable with this plan. All questions answered.  Patient is made aware that they may return to the emergency department for any worsening or new condition or for any other emergency.   -- Risk: 5 This patient has a high risk of morbidity due to further diagnostic testing or treatment. Rationale: This patients evaluation and management involve a high risk of morbidity due to the potential severity of presenting symptoms, need for diagnostic testing, and/or initiation of treatment that may require close  monitoring. The differential includes conditions with potential for significant deterioration or requiring escalation of care. Treatment decisions in the ED, including medication administration, procedural interventions, or disposition planning, reflect this level of risk. COPA: 5 The patient has the following acute or chronic illness/injury that poses a possible threat to life or bodily function: [X] : The patient has a potentially serious acute condition or an acute exacerbation of a chronic illness requiring urgent evaluation and management in the Emergency Department. The clinical presentation necessitates immediate consideration of life-threatening or function-threatening diagnoses, even if they are ultimately ruled out.  FINAL CLINICAL IMPRESSION(S) / ED DIAGNOSES   Final diagnoses:  Seizure-like activity (HCC)     Rx / DC Orders   ED Discharge Orders     None        Note:  This document was prepared using Dragon voice recognition software and may include unintentional dictation errors.   Nicholaus Rolland BRAVO, MD 05/27/24 1539  "
